# Patient Record
Sex: Female | Born: 1980 | Race: White | Hispanic: No | Marital: Married | Smoking: Never smoker
Health system: Southern US, Community
[De-identification: ages and names within clinical notes are randomized; demographics above are authoritative.]

## PROBLEM LIST (undated history)

## (undated) DIAGNOSIS — K5520 Angiodysplasia of colon without hemorrhage: Secondary | ICD-10-CM

## (undated) DIAGNOSIS — N946 Dysmenorrhea, unspecified: Secondary | ICD-10-CM

## (undated) DIAGNOSIS — R002 Palpitations: Secondary | ICD-10-CM

## (undated) DIAGNOSIS — J45909 Unspecified asthma, uncomplicated: Secondary | ICD-10-CM

## (undated) HISTORY — DX: Unspecified asthma, uncomplicated: J45.909

## (undated) HISTORY — DX: Angiodysplasia of colon without hemorrhage: K55.20

## (undated) HISTORY — DX: Dysmenorrhea, unspecified: N94.6

## (undated) HISTORY — PX: OTHER SURGICAL HISTORY: SHX169

## (undated) HISTORY — PX: WISDOM TOOTH EXTRACTION: SHX21

## (undated) HISTORY — DX: Palpitations: R00.2

---

## 2000-10-22 ENCOUNTER — Other Ambulatory Visit: Admission: RE | Admit: 2000-10-22 | Discharge: 2000-10-22 | Payer: Self-pay | Admitting: Obstetrics and Gynecology

## 2001-11-19 ENCOUNTER — Ambulatory Visit (HOSPITAL_COMMUNITY): Admission: RE | Admit: 2001-11-19 | Discharge: 2001-11-19 | Payer: Self-pay | Admitting: Obstetrics and Gynecology

## 2001-11-19 ENCOUNTER — Encounter: Payer: Self-pay | Admitting: Obstetrics and Gynecology

## 2001-12-17 ENCOUNTER — Encounter: Payer: Self-pay | Admitting: Obstetrics and Gynecology

## 2001-12-17 ENCOUNTER — Ambulatory Visit (HOSPITAL_COMMUNITY): Admission: RE | Admit: 2001-12-17 | Discharge: 2001-12-17 | Payer: Self-pay | Admitting: Obstetrics and Gynecology

## 2003-04-26 ENCOUNTER — Other Ambulatory Visit: Admission: RE | Admit: 2003-04-26 | Discharge: 2003-04-26 | Payer: Self-pay | Admitting: Obstetrics and Gynecology

## 2004-05-08 ENCOUNTER — Other Ambulatory Visit: Admission: RE | Admit: 2004-05-08 | Discharge: 2004-05-08 | Payer: Self-pay | Admitting: Obstetrics and Gynecology

## 2005-04-13 ENCOUNTER — Other Ambulatory Visit: Admission: RE | Admit: 2005-04-13 | Discharge: 2005-04-13 | Payer: Self-pay | Admitting: Obstetrics and Gynecology

## 2007-01-10 ENCOUNTER — Ambulatory Visit (HOSPITAL_COMMUNITY): Admission: RE | Admit: 2007-01-10 | Discharge: 2007-01-10 | Payer: Self-pay | Admitting: Internal Medicine

## 2007-05-30 ENCOUNTER — Encounter
Admission: RE | Admit: 2007-05-30 | Discharge: 2007-05-30 | Payer: Self-pay | Admitting: Physical Medicine and Rehabilitation

## 2007-06-19 DIAGNOSIS — K5520 Angiodysplasia of colon without hemorrhage: Secondary | ICD-10-CM

## 2007-06-19 HISTORY — DX: Angiodysplasia of colon without hemorrhage: K55.20

## 2007-09-29 ENCOUNTER — Inpatient Hospital Stay (HOSPITAL_COMMUNITY): Admission: EM | Admit: 2007-09-29 | Discharge: 2007-09-30 | Payer: Self-pay | Admitting: Emergency Medicine

## 2007-10-07 ENCOUNTER — Ambulatory Visit (HOSPITAL_COMMUNITY): Admission: RE | Admit: 2007-10-07 | Discharge: 2007-10-07 | Payer: Self-pay | Admitting: Internal Medicine

## 2007-10-22 ENCOUNTER — Ambulatory Visit (HOSPITAL_COMMUNITY): Admission: RE | Admit: 2007-10-22 | Discharge: 2007-10-22 | Payer: Self-pay | Admitting: Gastroenterology

## 2007-12-23 ENCOUNTER — Inpatient Hospital Stay (HOSPITAL_COMMUNITY): Admission: EM | Admit: 2007-12-23 | Discharge: 2007-12-26 | Payer: Self-pay | Admitting: Emergency Medicine

## 2010-08-26 ENCOUNTER — Inpatient Hospital Stay (HOSPITAL_COMMUNITY)
Admission: AD | Admit: 2010-08-26 | Discharge: 2010-08-26 | Disposition: A | Discharge status: Home / Self Care | Payer: Managed Care, Other (non HMO) | Source: Ambulatory Visit | Attending: Obstetrics and Gynecology | Admitting: Obstetrics and Gynecology

## 2010-08-26 DIAGNOSIS — O4700 False labor before 37 completed weeks of gestation, unspecified trimester: Secondary | ICD-10-CM | POA: Insufficient documentation

## 2010-08-26 LAB — URINALYSIS, ROUTINE W REFLEX MICROSCOPIC
Bilirubin Urine: NEGATIVE
Ketones, ur: NEGATIVE mg/dL
Nitrite: NEGATIVE
Protein, ur: NEGATIVE mg/dL
Specific Gravity, Urine: 1.005 (ref 1.005–1.030)
Urobilinogen, UA: 0.2 mg/dL (ref 0.0–1.0)

## 2010-10-13 ENCOUNTER — Inpatient Hospital Stay (HOSPITAL_COMMUNITY): Admission: AD | Admit: 2010-10-13 | Payer: Self-pay | Admitting: Obstetrics and Gynecology

## 2010-10-15 ENCOUNTER — Inpatient Hospital Stay (HOSPITAL_COMMUNITY)
Admission: AD | Admit: 2010-10-15 | Discharge: 2010-10-15 | Disposition: A | Discharge status: Home / Self Care | Payer: Managed Care, Other (non HMO) | Source: Ambulatory Visit | Attending: Obstetrics and Gynecology | Admitting: Obstetrics and Gynecology

## 2010-10-15 DIAGNOSIS — O479 False labor, unspecified: Secondary | ICD-10-CM | POA: Insufficient documentation

## 2010-10-16 ENCOUNTER — Inpatient Hospital Stay (HOSPITAL_COMMUNITY)
Admission: AD | Admit: 2010-10-16 | Discharge: 2010-10-18 | DRG: 775 | Disposition: A | Discharge status: Home / Self Care | Payer: Managed Care, Other (non HMO) | Source: Ambulatory Visit | Attending: Obstetrics and Gynecology | Admitting: Obstetrics and Gynecology

## 2010-10-16 DIAGNOSIS — Z22330 Carrier of Group B streptococcus: Secondary | ICD-10-CM

## 2010-10-16 DIAGNOSIS — O99892 Other specified diseases and conditions complicating childbirth: Secondary | ICD-10-CM | POA: Diagnosis present

## 2010-10-16 LAB — RPR: RPR Ser Ql: NONREACTIVE

## 2010-10-16 LAB — CBC
MCH: 33 pg (ref 26.0–34.0)
MCHC: 34.2 g/dL (ref 30.0–36.0)
Platelets: 252 10*3/uL (ref 150–400)
RBC: 3.91 MIL/uL (ref 3.87–5.11)
RDW: 13.9 % (ref 11.5–15.5)

## 2010-10-18 LAB — CBC
HCT: 30.2 % — ABNORMAL LOW (ref 36.0–46.0)
Hemoglobin: 10.1 g/dL — ABNORMAL LOW (ref 12.0–15.0)
MCH: 32.3 pg (ref 26.0–34.0)
MCV: 96.5 fL (ref 78.0–100.0)
RBC: 3.13 MIL/uL — ABNORMAL LOW (ref 3.87–5.11)

## 2010-10-31 NOTE — Op Note (Signed)
NAMEJASIA, Audrey Davis             ACCOUNT NO.:  [id number]   MEDICAL RECORD NO.:  [id number]          PATIENT TYPE:  INP   LOCATION:  1843                         FACILITY:  [facility]   PHYSICIAN:  John C. Madilyn Fireman, M.D.    DATE OF BIRTH:  1980/12/26   DATE OF PROCEDURE:  12/23/2007  DATE OF DISCHARGE:                               OPERATIVE REPORT   REASON FOR CONSULTATION:  Recurrent upper GI bleed with hematemesis with  etiology undiagnosed after a thorough workup including EGD, colonoscopy  and capsule endoscopy approximately one month ago.  She also presented  with hematemesis at time as well.   PROCEDURE:  The patient was placed in the left lateral decubitus  position and placed on the pulse monitor with continuous low-flow oxygen  delivered by nasal cannula.  She was sedated with 100 mcg IV fentanyl  and 10 mg IV Versed.  The Olympus video endoscope was advanced under  direct vision into the oropharynx and esophagus.  The esophagus was  straight and of normal caliber with the squamocolumnar line at 38 cm.  There was no visible hiatal hernia, ring, stricture or other abnormality  of the GE junction.  The stomach was entered, and there was a fairly  large body of amorphous viscous, dark brown to [employer] with  white solid food consistent with sandwich he had recently eaten.  This  was very viscous and did not suction or dilute itself with lavage very  easily.  This obscured good bit of the posterior wall and greater  curvature of the fundus up to near the cardia; however, I was able to  visualize the cardia which appeared to be normal and free of any obvious  bleeding lesion.  Just distal to the fundic pool, there was an elevated,  ovoid, cherry red lesion possibly consistent with a Dieulafoy lesion.  However, it was not bleeding, and there was no clot associated with it,  and it could have represented simple nasogastric tube trauma.  Distal to  the pool of old blood and  food, visualization was fairly good, although  there was some adherent coffee ground material that for the most part  lavaged fairly easily, and I did not see any potential bleeding lesions.  The distal antrum and pylorus was well inspected and appeared to be  within normal limits.  The duodenum was entered, and both bulb and  second portion were well inspected and appeared to be within normal  limits except for some slight contact friability in the distal bulb but  no obvious vascular lesion there.  The scope was withdrawn back to the  earlier mentioned possible Dieulafoy lesion, and two Endo-Clips were  placed.  This did not result in any bleeding.  The scope was then  withdrawn, and the patient returned to the recovery room in stable  condition.  She tolerated the procedure well.  There were no immediate  complications.   IMPRESSION:  1. Evidence of recent bleeding within the stomach but no obvious      active bleeding at this time.  2. 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   PLAN:  Continue resuscitation, IV fluids, IV Protonix and will probably  need repeat endoscopy for better diagnostic accuracy within the next 24-  48 hours.           ______________________________  Everardo All Madilyn Fireman, M.D.     JCH/MEDQ  D:  12/23/2007  T:  12/23/2007  Job:  [id number]

## 2010-10-31 NOTE — H&P (Signed)
NAMEJASDEEP, Audrey Davis             ACCOUNT NO.:  [id number]   MEDICAL RECORD NO.:  [id number]          PATIENT TYPE:  INP   LOCATION:  1829                         FACILITY:  [facility]   PHYSICIAN:  Darryl D. Prime, MD    DATE OF BIRTH:  [birth year]   DATE OF ADMISSION:  09/28/2007  DATE OF DISCHARGE:                              HISTORY & PHYSICAL   CODE STATUS:  The patient is a full code.   PRIMARY CARE PHYSICIAN:  Dr. Marisue Brooklyn, [facility].   HISTORIAN:  The patient; she is a good historian.   TOTAL VISIT TIME:  Approximately 40 minutes.   CHIEF COMPLAINT:  Just threw up blood.   HISTORY OF PRESENT ILLNESS:  Audrey Davis is a 30 year old female with a  history of painful menses, with her menses starting about a week ago.  She usually takes NSAIDs in the form of naproxen for her painful periods  and took naproxen 220 mg x4 a week ago; she also took 3 two days ago and  3 tablets the day before that. The patient notes apparently low flow the  2nd day of her cycle, which is unusual for her, and she then developed 4  days prior to admission, a headache, retro-orbital on the right.  The  patient also started noting abdominal bloating and back pain.  Two days  prior to admission, she had severe nausea and lightheaded.  One day  prior to admission, she had another episode in the nighttime of nausea  and syncope in the bathroom.  The patient had shortly thereafter black  stools, very loose.  She was drowsy all day today and at 9 p.m. on the  evening prior to admission, she had apparent coffee-grounds emesis and  EMS was called.  On arrival to the emergency room here, she had another  bout of coffee-grounds emesis, supposedly copious amounts for both.  The  patient, in the emergency room, was found to have a blood pressure of  76/44; three liters of fluid were given and blood pressure increased to  104/60.  In the emergency room she was given Zofran.   PAST MEDICAL  AND SURGICAL HISTORY:  1. History of remote migraine headache.  2. History of dysmenorrhea.   MEDICATIONS:  She is on as-needed naproxen for 10 years intermittently  for painful periods.   ALLERGIES:  No known drug allergies.   SOCIAL HISTORY:  No history of tobacco.  Rare alcohol use.  She has been  married for the last 8 years, no children.  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   FAMILY HISTORY:  The patient's family history is negative for GI  cancers.  Grandmother had breast cancer.   REVIEW OF SYSTEMS:  Fourteen-point review of systems is negative, other  than as stated above.   PHYSICAL EXAMINATION:  VITAL SIGNS:  As above for the blood pressure.  Her temperature is 96.4, respiratory rate of 16, pulse of 92-106,  saturations 100% on room air.  GENERAL:  She is a female who looks her stated age, lying flat  in bed,  in no acute distress.  HEENT:  Normocephalic, atraumatic.  Her pupils are equal, round and  reactive to light with extraocular movements being intact.  Conjunctivae  are significantly pale.  The oropharynx is dry.  SKIN:  Her skin is pale in general.  NECK:  Supple with no lymphadenopathy or thyromegaly.  No jugulovenous  distention.  LUNGS:  Clear to auscultation bilaterally.  CARDIOVASCULAR:  Regular rhythm with fast rate with no murmurs, rubs, or  gallops, normal S1 and S2, no S3 or S4.  ABDOMEN:  Soft, nontender and non-distended with no signs of  hepatosplenomegaly.  EXTREMITIES:  No clubbing, cyanosis or edema.  NEUROLOGIC:  She is alert and oriented x4 and cranial nerves II-XII  grossly intact and strength and sensation are grossly intact.  Reflexes  are 2+ and symmetric bilaterally across the knees.   LABORATORY DATA:  Laboratory data show a white count of 17, hemoglobin  between 8.5 and 8.8, hematocrit of 25 to 25.6, platelets 230,000, segs  77, lymphocytes 19.  She is Gastroccult positive with the contents  brought  by EMS.  Sodium 138, potassium 3.4, chloride 106, bicarb 20, BUN  41, creatinine 0.8, glucose of 183.   ASSESSMENT AND PLAN:  1. This is a patient with intermittent nonsteroidal anti-inflammatory      drug use who presents with upper gastrointestinal bleed.  It may be      a nonsteroidal anti-inflammatory drug gastritis and most likely an      ulcer, either duodenal or gastric.  For her upper gastrointestinal      bleed, we will get serial hematocrits and give intravenous fluids      and blood, check an H. pylori.  Gastroenterology Medicine was      consulted and will see her.  Protonix 40 mg intravenously now and      then every 12 hours and discussed the discontinuation of      nonsteroidal anti-inflammatory drugs with the patient.  2. For her hypovolemia and low blood pressure, we will give packed red      blood cells and follow.  3. For her hypokalemia, we will replete.  4. For elevated blood sugar, we will follow.  Likely, this is stress      related.  5. For her leukocytosis, we will follow, also likely stress related.      We will check a lipase.  6. Prophylaxis for deep venous thrombosis with pneumatic compression      devices.      Darryl D. Prime, MD  Electronically Signed     DDP/MEDQ  D:  09/29/2007  T:  09/29/2007  Job:  [id number]

## 2010-10-31 NOTE — Op Note (Signed)
Audrey Davis, Audrey Davis NO.:  [id number]   MEDICAL RECORD NO.:  [id number]         PATIENT TYPE:  CINP   LOCATION:                               FACILITY:  [facility]   PHYSICIAN:  Shirley Friar, MDDATE OF BIRTH:  [birth year]   DATE OF PROCEDURE:  12/25/2007  DATE OF DISCHARGE:                               OPERATIVE REPORT   INDICATIONS:  Hematemesis, recent upper endoscopy which showed a  possible Dieulafoy lesion in the proximal stomach, and repeat upper  endoscopy done to re-evaluate stomach due to moderate amount of food  particles during previous upper endoscopy.   MEDICATIONS:  1. Fentanyl 75 mcg IV.  2. Versed 6 mg IV.   FINDINGS:  Endoscope was inserted through oropharynx and esophagus was  intubated which was normal in its entirety.  Endoscope was advanced down  into the stomach where 2 hemoclips were noted be attached to the focal  area of the fundic wall with focal area of erythema between the 2  hemoclips.  No active bleeding was seen in the stomach.  The stomach was  completely visualized and no other abnormalities were seen.  Retroflexion was done which revealed normal proximal stomach, otherwise.  Endoscope was straightened and advanced to the duodenal bulb and the  second portion of the duodenum which were both normal.  Endoscope was  then withdrawn to confirm the above findings.   ASSESSMENT:  1. 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  2. No other gastric abnormalities seen.   PLAN:  1. Clear liquid diet and advance as tolerated.  2. Avoid NSAIDs.      Shirley Friar, MD  Electronically Signed     VCS/MEDQ  D:  12/25/2007  T:  12/26/2007  Job:  [id number]   cc:   Danise Edge, M.D.  John C. Madilyn Fireman, M.D.

## 2010-10-31 NOTE — Op Note (Signed)
NAMELAQUONDA, WELBY NO.:  [id number]   MEDICAL RECORD NO.:  [id number]          PATIENT TYPE:  INP   LOCATION:  6711                         FACILITY:  [facility]   PHYSICIAN:  Danise Edge, M.D.   DATE OF BIRTH:  1980/07/10   DATE OF PROCEDURE:  DATE OF DISCHARGE:                               OPERATIVE REPORT   PROCEDURE INDICATIONS:  Ms. Chesney Suares is a 30 year old female born  on 08/14/1980.  Ms. Flesch was admitted to [facility]  through the emergency room to evaluate gastrointestinal bleeding  manifested by hematemesis and the passage of dark stool.  She was taking  nonsteroidal anti-inflammatory drugs due to dysmenorrhea.  There is no  history of peptic ulcer disease.   MEDICATION ALLERGIES:  None.   PAST MEDICAL - SURGICAL HISTORY:  Migraine headaches and dysmenorrhea.   HABITS:  Ms. Schwenn does not use tobacco products and consumes alcohol  in moderation.   ENDOSCOPIST:  Danise Edge, M.D.   PREMEDICATIONS:  Fentanyl 25 mcg and Versed 5 mg.   PROCEDURE:  After obtaining informed consent, Ms. Courter was placed in  the left lateral decubitus position.  I administered intravenous  fentanyl and intravenous Versed to achieve conscious sedation for the  procedure.  The patient's blood pressure, oxygen saturation, and cardiac  rhythm were monitored throughout the procedure and documented in the  medical record.   The Pentax gastroscope was passed through the posterior hypopharynx into  the proximal esophagus without difficulty.  The hypopharynx, larynx, and  vocal cords appeared normal.   ESOPHAGOSCOPY:  The proximal mid and lower segments of the esophageal  mucosa appears completely normal.  There are no signs of esophageal  bleeding.  There are no signs of esophageal varices or Mallory-Weiss  tears.   GASTROSCOPY:  Retroflex view of the gastric cardia and fundus reveals 2  small erosions in the gastric fundus.  The gastric  body appears normal.  There are scattered nonbleeding erosions in the gastric antrum.  The  gastric pylorus appears normal.   DUODENOSCOPY:  The duodenal bulb and second portion of duodenum appear  completely normal.   ASSESSMENT:  Essentially normal esophagogastroduodenoscopy except for  nonbleeding scattered erosions in the gastric antrum and 2 isolated  erosions in the gastric fundus.  I have no explanation for Ms.  Brue's apparent upper gastrointestinal bleeding.   RECOMMENDATIONS:  Transferred to a medical bed on the floor.  Regular  diet.  If signs of bleeding persists, schedule capsule enteroscopy.           ______________________________  Danise Edge, M.D.     MJ/MEDQ  D:  09/29/2007  T:  09/30/2007  Job:  [id number]

## 2010-10-31 NOTE — Discharge Summary (Signed)
NAMESELINA, TAPPER             ACCOUNT NO.:  [id number]   MEDICAL RECORD NO.:  [id number]           PATIENT TYPE:   LOCATION:                                 FACILITY:   PHYSICIAN:  Shirley Friar, MDDATE OF BIRTH:  11/17/1965   DATE OF ADMISSION:  12/23/2007  DATE OF DISCHARGE:  12/26/2007                               DISCHARGE SUMMARY   ADMITTING DIAGNOSIS:  Hematemesis and melena.   DISCHARGE DIAGNOSES:  1. Dieulafoy arteriovenous malformation.  2. Upper gastrointestinal bleed and acute blood loss anemia secondary      to Dieulafoy arteriovenous malformation.  3. Consistent with her past medical history, severe dysmenorrhea, as      well as migraine headaches.   SERVICE:  Gastroenterology.   ATTENDING:  Everardo All. Madilyn Fireman, MD   PROCEDURES:  1. On December 23, 2007, she had an upper endoscopy with Dr. Dorena Cookey who      found evidence of recent bleeding in the stomach without active      bleeding.  He also found a possible Dieulafoy AVM and applied two      EndoClips to it to obtain homeostasis.  2. On December 25, 2007, upper endoscopy by Dr. Charlott Rakes.   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  No other gastric abnormalities seen.   PLAN:  Clear liquid diet and advance as tolerated, avoid NSAIDs.   CONSULTS:  None.   HISTORY AND PHYSICAL:  This is a very pleasant 30 year old female who  experienced hematemesis and melena during menses in April 2009.  She  required hospitalization and was treated by Dr. Danise Edge.  No  source of bleeding was found through a full GI workup that included an  inpatient upper endoscopy, as well as a colonoscopy and a given capsule  endoscopy as an outpatient in April-May time frame.  Her current episode  of hematemesis and melena was again preceded by the onset of her menses,  as well as nausea and back pain.  She tells me that immediately prior  coming to the ER she had palpitations.   Her hematemesis began once she  was admitted to the emergency room .  She became hypotensive and was  given a fluid bolus.  She was also given 2 units of packed red blood  cells and emergently endoscoped.  She was admitted to the step-down  unit, placed on IV Protonix infusion and monitored closely.  Her  hemoglobin dropped to a level of 7.1.  the next morning after her 2  units of packed red blood cells, her hemoglobin was 10.3.  It did drop  to 9.2 during the day.  She had had no bowel movements overnight and she  was feeling much better.  On Thursday December 25, 2007, she was re-  endoscoped by Dr. Charlott Rakes who found no bleeding.  EndoClips  were in place.  No further lesions were seen. After her endoscopy, her  diet was advanced and she was given one more unit of blood for a total  of three.  This morning on  December 17, 2007, the patient described no pain,  no hematemesis.  She was tolerating her full diet well.  She looked well  and reported that she was ready to go home.   PHYSICAL EXAMINATION:  She was alert and oriented in no apparent  distress.  Her lungs sounded clear to auscultation.  Her heart had a regular rate and rhythm with no murmurs, rubs or  gallops.  Her abdomen was soft, nontender, nondistended with good bowel sounds.   LABS:  Showed a hemoglobin of 11.3 that was on December 25, 2007, post  transfusion.  I would expect that her hemoglobin would drop to  approximately 10.2 after equilibration.   She was discharged to home in good condition.  Follow-up appointment was  arranged with Dr. Danise Edge in 3-4 weeks.  She was given a  prescription for Protonix 40 mg 1 pill daily for 4 weeks, Phenergan 25  mg p.o. 1 tablet q.12 p.r.n. nausea  #30, Darvocet-N 100 one tablet p.o. q. 4-6 hours p.r.n. pain, #30 no  refills were given.  The patient was advised to call [facility]  Gastro[facility]ology with any signs of hematemesis or further bleeding at  [phone].      Stephani Police, [place]      Shirley Friar, MD  Electronically Signed    MLY/MEDQ  D:  12/26/2007  T:  12/27/2007  Job:  [id number]   cc:   Marcelino Duster L. Vincente Poli, M.D.  Lovenia Kim, D.O.  Danise Edge, M.D.

## 2010-10-31 NOTE — Op Note (Signed)
[name], [name]             ACCOUNT NO.:  [id number]   MEDICAL RECORD NO.:  [id number]          PATIENT TYPE:  AMB   LOCATION:  ENDO                         [facility]ACILITY:  [facility]   PHYSICIAN:  Danise Edge, M.D.   DATE OF BIRTH:  1981/05/25   DATE OF PROCEDURE:  10/22/2007  DATE OF DISCHARGE:  10/22/2007                               OPERATIVE REPORT   PROCEDURE:  Given capsule enteroscopy.   INDICATIONS FOR PROCEDURE:  [name] is a 30 year old female  born 1980/07/14.  [name] was admitted to [facility]  through the emergency room on September 28, 2007, to evaluate  gastrointestinal bleeding manifested by hematemesis and the passage of  dark stool.  She was taking nonsteroidal anti-inflammatory drugs to  treat this menorrhagia.  There was no past history of peptic ulcer  disease.   ALLERGIES:  No known drug allergies.   PAST MEDICAL - SURGICAL HISTORY:  Migraine headaches and dysmenorrhea.   HABITS:  Audrey Davis does not use tobacco products and consumes alcohol  in moderation.   On September 29, 2007, her esophagogastroduodenoscopy was normal.  She  received 2 units packed red blood cells and discharged from the  hospital.   Her discharge hemoglobin was 9.5 grams.  She was followed up by her  primary care physician and when her hemoglobin dropped to 7 grams, she  received 2 units of fresh frozen plasma but did not receive packed red  blood cells.   Approximately one week ago, she underwent a repeat  esophagogastroduodenoscopy which was normal.  She also underwent a  proctocolonoscopy to the cecum which was normal.   On Oct 22, 2007, she underwent a Given capsule enteroscopy.   CAPSULE ENDOSCOPY REPORT:  The first gastric image is noted at 50  minutes.  The first duodenal image is noted at 2 hours 21 minutes and 54  seconds.  The first cecal image is noted at 7 hours 1 minute and 37  seconds.   The small bowel capsule enteroscopy was  completely normal.  No bleeding  or small bowel lesions were identified.   ASSESSMENT:  Normal capsule enteroscopy performed Oct 22, 2007.           ______________________________  Danise Edge, M.D.     MJ/MEDQ  D:  10/24/2007  T:  10/24/2007  Job:  [id number]   cc:   Lovenia Kim, D.O.  Fax: [phone]

## 2010-10-31 NOTE — Discharge Summary (Signed)
Audrey Davis, Audrey Davis             ACCOUNT NO.:  [id number]   MEDICAL RECORD NO.:  [id number]          PATIENT TYPE:  INP   LOCATION:  6711                         FACILITY:  [facility]   PHYSICIAN:  Hillery Aldo, M.D.   DATE OF BIRTH:  Aug 13, 1980   DATE OF ADMISSION:  09/28/2007  DATE OF DISCHARGE:  09/30/2007                               DISCHARGE SUMMARY   PRIMARY CARE PHYSICIAN:  Lovenia Kim, D.O.   DISCHARGE DIAGNOSES:  1. Acute upper gastrointestinal bleed, secondary to antral erosions      from nonsteroidal anti-inflammatory medications.  2. Hypokalemia, repleted.  3. Leukocytosis, resolved.  4. Transient hypotension.  5. Transient hyperglycemia.  6. Dysmenorrhea.   DISCHARGE MEDICATIONS:  Protonix 40 mg daily.   CONSULTATIONS:  Danise Edge, M.D. of Gastroenterology.   BRIEF ADMISSION HISTORY OF PRESENT ILLNESS:  The patient is a very  pleasant 30 year old female who presented to the hospital with chief  complaint of hematemesis and melena.  The patient had been taking  longstanding nonsteroidal anti-inflammatory medications to treat her  underlying dysmenorrhea.  The patient presented to the hospital and upon  initial evaluation, was found to be hypotensive and anemic and therefore  was admitted for further evaluation and workup.  For the full details,  please see the dictated report done by Dr. Oralia Rud.   PROCEDURES AND DIAGNOSTIC STUDIES:  Upper endoscopy on 09/29/2007 showed  2 small erosions in the gastric fundus.  The gastric body was normal.  There were scattered non-bleeding erosions in the gastric antrum.  Gastric pylorus was normal.  The duodenal bulb and second portion of the  duodenum were normal.   DISCHARGE LABORATORY VALUES:  Sodium was 140, potassium 3.9, chloride  110, bicarb 25, BUN 8, creatinine 0.61, and glucose 92.  White blood  cell count was 6.6, hemoglobin 9.5, hematocrit 27.4, and platelets 159.  Hemoglobin A1c was 4.8%.   HOSPITAL  COURSE:  1. Acute upper GI bleed, likely due to antral and fundal erosions      secondary to nonsteroidal anti-inflammatory medications:  Although      the patient underwent upper endoscopy, no active bleeding was      noted.  Nevertheless, the patient was stabilized and transfused      with 2 units of packed red blood cells.  Hemoglobin appropriately      rose and was stable with no further complaints of hematemesis.  The      patient was advised to discontinue all nonsteroidal anti-      inflammatory drug use and was put on IV proton pump inhibitor      therapy initially.  She will be discharged on additional therapy      with proton pump inhibitor medications.  If she develops any      further signs of bleeding, she will need a capsule enteroscopy.  2. Hypokalemia:  The patient was appropriately repleted.  3. Leukocytosis:  The patient had a mild leukocytosis on initial      presentation.  This was likely a stress reaction.  It quickly      resolved at the  time of discharge.  4. Hypotension:  Likely due to acute blood loss, anemia.  She was      hemodynamically stable after receiving 2 units of packed red blood      cells.  5. Hyperglycemia:  The patient had transient hyperglycemia, again      likely due to a stress response.  Her hemoglobin A1c value was      normal.   DISPOSITION:  The patient was medically stable and will be discharged  home.  She should follow up with her primary care physician next week.      Hillery Aldo, M.D.  Electronically Signed     CR/MEDQ  D:  09/30/2007  T:  10/01/2007  Job:  [id number]

## 2010-10-31 NOTE — H&P (Signed)
Audrey Davis, Audrey Davis             ACCOUNT NO.:  [id number]   MEDICAL RECORD NO.:  [id number]          PATIENT TYPE:  INP   LOCATION:  2626                         FACILITY:  [facility]   PHYSICIAN:  Audrey Davis, M.D.    DATE OF BIRTH:  1980-10-21   DATE OF ADMISSION:  12/23/2007  DATE OF DISCHARGE:                              HISTORY & PHYSICAL   REASON FOR ADMISSION:  Hematemesis and melena, admit to gastroenterology  service.   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  Her upper endoscopy done on September 29, 2007, by Dr. Danise Edge was normal.  The patient was discharged  home on September 30, 2007.  In followup, she had a colonoscopy as well as a  given capsule endoscopy by Dr. Danise Edge, which were both normal.  She has had no recurrence of her hematemesis until today.  The patient  is currently vomiting in the emergency room, so her history was  collected from her family at bedside.  The patient has a long history of  dysmenorrhea with nausea, anorexia, and low-grade fevers associated with  it.  Both her hematemesis in April as well as her hematemesis today  occurred during her menstrual cycle.  She tells me she sees no NSAIDs  since April.  She takes only homeopathic medications.  She has no other  illnesses.  Stools became dark again this past Sunday, feelings of  weakness and nausea started again as well.  She came to the hospital  today secondary to black stools but on her way actually had a  presyncopal episode in her car.  She began vomiting blood in the  emergency room an NG tube was placed, during which a large amount of  bright red blood came up.  She is hypotensive.  Her blood pressure was  initially 73/46 with fluids.  Her blood pressure has come up to 115/60.   Past medical history is significant for dysmenorrhea as well as  remote  history of migraine headaches.   CURRENT MEDICATIONS:  1. Magnesia phosphorica.  She takes 3-4 pills a day.  2. Colocynthis.  These are both taken for abdominal cramps.  Again,      she denies any NSAID use.   Review of systems as per HPI.   Social history is negative for tobacco.  She drinks a moderate amount of  alcohol, approximately two glasses of red wine three times a week;  however, she does report having 2 kg this past weekend on her husband's  birthday.   Family history is negative for gastric cancer, ulcers, and liver  disease.   On physical exam, she is alert and oriented but in some distress that  she is vomiting blood as an NG tube is being placed.  Vital signs of  454, temperature 98, pulse 112, respirations 22, and blood pressure  115/68.  Heart has a rate of 112 but no murmurs, rubs, or gallops were  appreciated.  Lungs are clear to auscultation  bilaterally.  Abdomen is  soft, nontender, and nondistended with good bowel sounds.   Labs show hemoglobin of 8.8, it was 9.4 at discharge in April, however,  her husband tells me that it was recently up to approximately 12.  White  count 7.8, hematocrit 12.9, and platelets 216,000.  Her BUN is 36,  creatinine 0.6, and glucose is 111.  Chest x-ray taken today shows no  acute chest process.  It does report gastric distention with air.   ASSESSMENT:  Dr. Dorena Cookey has seen and examined the patient, collected  her history, and reviewed her chart.  His impression is this is a 32-  year-old female with a recurrence of melena and hematemesis of uncertain  origin at this point appeared to be related to administration of cycle.   PLAN:  Stabilize the patient.  Admit to gastroenterology.  Plan for an  upper endoscopy within the next hour and half.  We will start Protonix  infusion, fluids, and bloods.      Stephani Police, PA    ______________________________  Audrey Davis, M.D.    MLY/MEDQ  D:  12/23/2007   T:  12/24/2007  Job:  [id number]   cc:   Danise Edge, M.D.

## 2011-03-13 LAB — CBC
HCT: 25.6 — ABNORMAL LOW
HCT: 27.4 — ABNORMAL LOW
HCT: 31.2 — ABNORMAL LOW
Hemoglobin: 8.8 — ABNORMAL LOW
Hemoglobin: 9.5 — ABNORMAL LOW
MCHC: 34.4
MCV: 98.4
Platelets: 163
RBC: 2.8 — ABNORMAL LOW
RDW: 13
RDW: 13.8
RDW: 14
WBC: 6.6

## 2011-03-13 LAB — TYPE AND SCREEN

## 2011-03-13 LAB — POCT PREGNANCY, URINE
Operator id: 277751
Preg Test, Ur: NEGATIVE

## 2011-03-13 LAB — URINALYSIS, ROUTINE W REFLEX MICROSCOPIC
Hgb urine dipstick: NEGATIVE
Nitrite: NEGATIVE
Specific Gravity, Urine: 1.027
Urobilinogen, UA: 0.2
pH: 5.5

## 2011-03-13 LAB — BASIC METABOLIC PANEL
BUN: 15
Calcium: 7.4 — ABNORMAL LOW
GFR calc non Af Amer: >60
GFR calc non Af Amer: >60
Glucose, Bld: 80
Glucose, Bld: 92
Potassium: 3.9
Sodium: 140

## 2011-03-13 LAB — DIFFERENTIAL
Basophils Absolute: 0.1
Basophils Relative: 0
Eosinophils Absolute: 0.2
Eosinophils Relative: 1
Lymphocytes Relative: 19
Monocytes Absolute: 0.6

## 2011-03-13 LAB — PREPARE FRESH FROZEN PLASMA

## 2011-03-13 LAB — POCT I-STAT, CHEM 8
Creatinine, Ser: 0.8
Glucose, Bld: 183 — ABNORMAL HIGH
Hemoglobin: 8.5 — ABNORMAL LOW
TCO2: 20

## 2011-03-13 LAB — HEMOGLOBIN AND HEMATOCRIT, BLOOD: Hemoglobin: 10.7 — ABNORMAL LOW

## 2011-03-13 LAB — LIPASE, BLOOD: Lipase: 14

## 2011-03-13 LAB — HEMOGLOBIN A1C: Hgb A1c MFr Bld: 4.8

## 2011-03-13 LAB — GASTRIC OCCULT BLOOD (1-CARD TO LAB): pH, Gastric: 5

## 2011-03-15 LAB — URINALYSIS, ROUTINE W REFLEX MICROSCOPIC
Ketones, ur: NEGATIVE
Nitrite: NEGATIVE
Specific Gravity, Urine: 1.021
pH: 7

## 2011-03-15 LAB — CBC
HCT: 21.2 — ABNORMAL LOW
HCT: 27 — ABNORMAL LOW
HCT: 27 — ABNORMAL LOW
HCT: 27.9 — ABNORMAL LOW
Hemoglobin: 9.2 — ABNORMAL LOW
Hemoglobin: 9.5 — ABNORMAL LOW
MCHC: 33.7
MCHC: 33.9
MCHC: 33.9
MCHC: 34.2
MCHC: 34.6
MCV: 87.8
MCV: 90
MCV: 90.7
Platelets: 153
Platelets: 162
Platelets: 168
Platelets: 216
RBC: 2.42 — ABNORMAL LOW
RDW: 18.8 — ABNORMAL HIGH
RDW: 18.9 — ABNORMAL HIGH
RDW: 18.9 — ABNORMAL HIGH
RDW: 19.1 — ABNORMAL HIGH
RDW: 21.9 — ABNORMAL HIGH
WBC: 7.5

## 2011-03-15 LAB — COMPREHENSIVE METABOLIC PANEL
AST: 20
Albumin: 3 — ABNORMAL LOW
Albumin: 3.1 — ABNORMAL LOW
Alkaline Phosphatase: 30 — ABNORMAL LOW
BUN: 6
Calcium: 8.5
Calcium: 8.5
Creatinine, Ser: 0.66
GFR calc Af Amer: >60
Potassium: 3.7
Sodium: 137
Total Protein: 4.6 — ABNORMAL LOW
Total Protein: 4.8 — ABNORMAL LOW

## 2011-03-15 LAB — DIFFERENTIAL
Basophils Relative: 0
Eosinophils Absolute: 0.1
Monocytes Absolute: 0.4
Neutro Abs: 5.7
Neutrophils Relative %: 74

## 2011-03-15 LAB — CROSSMATCH: ABO/RH(D): O POS

## 2011-03-15 LAB — POCT I-STAT, CHEM 8
Calcium, Ion: 1.18
Chloride: 107
HCT: 30 — ABNORMAL LOW
Potassium: 3.7

## 2011-03-15 LAB — PREPARE RBC (CROSSMATCH)

## 2011-03-15 LAB — URINE MICROSCOPIC-ADD ON

## 2011-03-15 LAB — HEMOGLOBIN AND HEMATOCRIT, BLOOD: Hemoglobin: 11.8 — ABNORMAL LOW

## 2011-03-15 LAB — POCT PREGNANCY, URINE: Operator id: 277751

## 2011-12-13 ENCOUNTER — Other Ambulatory Visit (HOSPITAL_COMMUNITY): Payer: Self-pay | Admitting: Internal Medicine

## 2011-12-13 DIAGNOSIS — R102 Pelvic and perineal pain: Secondary | ICD-10-CM

## 2011-12-13 DIAGNOSIS — R197 Diarrhea, unspecified: Secondary | ICD-10-CM

## 2011-12-18 ENCOUNTER — Ambulatory Visit (HOSPITAL_COMMUNITY): Payer: Managed Care, Other (non HMO)

## 2011-12-18 ENCOUNTER — Other Ambulatory Visit (HOSPITAL_COMMUNITY): Payer: Managed Care, Other (non HMO)

## 2011-12-18 ENCOUNTER — Ambulatory Visit (HOSPITAL_COMMUNITY): Admission: RE | Admit: 2011-12-18 | Payer: BC Managed Care – PPO | Source: Ambulatory Visit

## 2013-04-09 ENCOUNTER — Other Ambulatory Visit: Payer: Self-pay | Admitting: Obstetrics and Gynecology

## 2013-04-09 DIAGNOSIS — N63 Unspecified lump in unspecified breast: Secondary | ICD-10-CM

## 2013-04-23 ENCOUNTER — Ambulatory Visit
Admission: RE | Admit: 2013-04-23 | Discharge: 2013-04-23 | Disposition: A | Discharge status: Home / Self Care | Payer: Self-pay | Source: Ambulatory Visit | Attending: Obstetrics and Gynecology | Admitting: Obstetrics and Gynecology

## 2013-04-23 DIAGNOSIS — N63 Unspecified lump in unspecified breast: Secondary | ICD-10-CM

## 2013-09-17 ENCOUNTER — Other Ambulatory Visit: Payer: Self-pay | Admitting: Obstetrics and Gynecology

## 2013-09-17 DIAGNOSIS — N63 Unspecified lump in unspecified breast: Secondary | ICD-10-CM

## 2013-10-01 ENCOUNTER — Other Ambulatory Visit: Payer: Self-pay | Admitting: Emergency Medicine

## 2013-10-21 ENCOUNTER — Other Ambulatory Visit: Payer: BC Managed Care – PPO

## 2013-10-22 ENCOUNTER — Encounter (INDEPENDENT_AMBULATORY_CARE_PROVIDER_SITE_OTHER): Payer: Self-pay

## 2013-10-22 ENCOUNTER — Ambulatory Visit
Admission: RE | Admit: 2013-10-22 | Discharge: 2013-10-22 | Disposition: A | Discharge status: Home / Self Care | Payer: BC Managed Care – PPO | Source: Ambulatory Visit | Attending: Obstetrics and Gynecology | Admitting: Obstetrics and Gynecology

## 2013-10-22 DIAGNOSIS — N63 Unspecified lump in unspecified breast: Secondary | ICD-10-CM

## 2013-11-24 ENCOUNTER — Ambulatory Visit: Payer: Self-pay | Admitting: Emergency Medicine

## 2014-01-20 ENCOUNTER — Encounter: Payer: Self-pay | Admitting: Emergency Medicine

## 2014-03-03 ENCOUNTER — Encounter: Payer: Self-pay | Admitting: Emergency Medicine

## 2014-03-03 ENCOUNTER — Ambulatory Visit (INDEPENDENT_AMBULATORY_CARE_PROVIDER_SITE_OTHER): Payer: BC Managed Care – PPO | Admitting: Emergency Medicine

## 2014-03-03 VITALS — BP 96/60 | HR 68 | Temp 98.0°F | Resp 18 | Ht 68.5 in | Wt 166.0 lb

## 2014-03-03 DIAGNOSIS — R002 Palpitations: Secondary | ICD-10-CM | POA: Insufficient documentation

## 2014-03-03 DIAGNOSIS — D649 Anemia, unspecified: Secondary | ICD-10-CM

## 2014-03-03 DIAGNOSIS — Z111 Encounter for screening for respiratory tuberculosis: Secondary | ICD-10-CM

## 2014-03-03 DIAGNOSIS — Z0000 Encounter for general adult medical examination without abnormal findings: Secondary | ICD-10-CM

## 2014-03-03 MED ORDER — ALPRAZOLAM 0.5 MG PO TABS: ORAL_TABLET | ORAL | Status: DC

## 2014-03-03 NOTE — Progress Notes (Signed)
Subjective:    Patient ID: Audrey Davis, female    DOB: 09/21/1980, 33 y.o.   MRN: [id number]  HPI Comments: 33 yo WF CPE. She notes she is doing well overall and is without concerns. She is eating healthy and keeping busy.   She rarely uses xanax usually 1/2 tablet with increased stress and occasionally for sleep. She is doing internship for teaching and notes mild stress on occasion with internship and managing life.  She denies any unexplained fatigue or blood in stools with hx of AVM in the past.  She notes her allergies are staying controled with OTC Allegra.      Medication List       This list is accurate as of: 03/03/14 11:59 PM.  Always use your most recent med list.               ALPRAZolam 0.5 MG tablet  Commonly known as:  XANAX  TAKE 1/2 TO 1 TABLET BY MOUTH TWICE DAILY PRN     fexofenadine 60 MG tablet  Commonly known as:  ALLEGRA  Take 60 mg by mouth 2 (two) times daily as needed for allergies or rhinitis.     PRE-NATAL PO  Take by mouth daily.       Allergies  Allergen Reactions  . Hydrocodone     Itching   Past Medical History  Diagnosis Date  . AVM (arteriovenous malformation) of colon 2009  . Menses painful   . Palpitations   . Asthma    Past Surgical History  Procedure Laterality Date  . Wisdom tooth extraction    . Laproscopic     History  Substance Use Topics  . Smoking status: Never Smoker   . Smokeless tobacco: Not on file  . Alcohol Use: Not on file   Family History  Problem Relation Age of Onset  . Depression Mother   . Fibromyalgia Mother   . Hyperlipidemia Mother   . Cancer Maternal Grandmother 65  . Stroke Maternal Grandmother    MAINTENANCE: Colonoscopy:Per [facility] 2009? Mammo:04/23/13 repeat 6 months Pap/ Pelvic: 2014 WNL @ Gyn EYE: yearly Dentist:Q 6 month  IMMUNIZATIONS: Tdap: 2012 Influenza:  Patient Care Team: Unk Pinto, MD as PCP - General (Internal Medicine) Cyril Mourning, MD as Consulting  Physician (Obstetrics and Gynecology) Missy Sabins, MD as Consulting Physician (Gastroenterology) Macarthur Critchley, [place] as Referring Physician (Optometry) Jolayne Panther, (Dentist)   Review of Systems  Constitutional: Negative for fatigue.  Cardiovascular: Negative for chest pain.  Gastrointestinal: Negative for abdominal pain, diarrhea, constipation and anal bleeding.  Genitourinary: Negative for difficulty urinating.  All other systems reviewed and are negative.  BP 96/60  Pulse 68  Temp(Src) 98 F (36.7 C) (Temporal)  Resp 18  Ht 5' 8.5" (1.74 m)  Wt 166 lb (75.297 kg)  BMI 24.87 kg/m2  LMP 02/10/2014     Objective:   Physical Exam  Nursing note and vitals reviewed. Constitutional: She is oriented to person, place, and time. She appears well-developed and well-nourished. No distress.  HENT:  Head: Normocephalic and atraumatic.  Right Ear: External ear normal.  Left Ear: External ear normal.  Nose: Nose normal.  Mouth/Throat: Oropharynx is clear and moist.  Eyes: Conjunctivae and EOM are normal. Pupils are equal, round, and reactive to light. Right eye exhibits no discharge. Left eye exhibits no discharge. No scleral icterus.  Neck: Normal range of motion. Neck supple. No JVD present. No tracheal deviation present. No thyromegaly present.  Cardiovascular: Normal rate,  regular rhythm, normal heart sounds and intact distal pulses.   Pulmonary/Chest: Effort normal and breath sounds normal.  Abdominal: Soft. Bowel sounds are normal. She exhibits no distension and no mass. There is no tenderness. There is no rebound and no guarding.  Genitourinary:  Def gyn  Musculoskeletal: Normal range of motion. She exhibits no edema and no tenderness.  Lymphadenopathy:    She has no cervical adenopathy.  Neurological: She is alert and oriented to person, place, and time. She has normal reflexes. No cranial nerve deficit. She exhibits normal muscle tone. Coordination normal.  Skin: Skin is warm and dry. No  rash noted. No erythema. No pallor.  Psychiatric: She has a normal mood and affect. Her behavior is normal. Judgment and thought content normal.       EKG NSCSPT WNL [id number] Assessment & Plan:  1. CPE- Update screening labs/ History/ Immunizations/ Testing as needed. Advised healthy diet, QD exercise, increase H20 and continue RX/ Vitamins AD.  2. Anemia HX with AVM- Recheck labs, take Vitamins AD increase green leafy veggies, increase H2O, w/c if any symptom increase.   3. Anxiety- Controlled currently, continue RX AD w/c if SX increase or ER, recommend counseling if symptoms continue   4. Abnormal mammo- keep f/u 6 month advise weekly breast checks.

## 2014-03-03 NOTE — Patient Instructions (Signed)

## 2014-03-04 LAB — CBC WITH DIFFERENTIAL/PLATELET
Basophils Absolute: 0.1 10*3/uL (ref 0.0–0.1)
Basophils Relative: 1 % (ref 0–1)
Eosinophils Absolute: 0.3 10*3/uL (ref 0.0–0.7)
Eosinophils Relative: 4 % (ref 0–5)
HCT: 42.2 % (ref 36.0–46.0)
HEMOGLOBIN: 14.2 g/dL (ref 12.0–15.0)
LYMPHS PCT: 30 % (ref 12–46)
Lymphs Abs: 2.4 10*3/uL (ref 0.7–4.0)
MCH: 33 pg (ref 26.0–34.0)
MCHC: 33.6 g/dL (ref 30.0–36.0)
MCV: 98.1 fL (ref 78.0–100.0)
MONO ABS: 0.6 10*3/uL (ref 0.1–1.0)
MONOS PCT: 7 % (ref 3–12)
NEUTROS ABS: 4.6 10*3/uL (ref 1.7–7.7)
Neutrophils Relative %: 58 % (ref 43–77)
Platelets: 300 10*3/uL (ref 150–400)
RBC: 4.3 MIL/uL (ref 3.87–5.11)
RDW: 13.1 % (ref 11.5–15.5)
WBC: 7.9 10*3/uL (ref 4.0–10.5)

## 2014-03-04 LAB — URINALYSIS, ROUTINE W REFLEX MICROSCOPIC
Bilirubin Urine: NEGATIVE
GLUCOSE, UA: NEGATIVE mg/dL
HGB URINE DIPSTICK: NEGATIVE
Ketones, ur: 15 mg/dL — AB
Leukocytes, UA: NEGATIVE
Nitrite: NEGATIVE
PH: 5.5 (ref 5.0–8.0)
Protein, ur: NEGATIVE mg/dL
SPECIFIC GRAVITY, URINE: 1.016 (ref 1.005–1.030)
Urobilinogen, UA: 0.2 mg/dL (ref 0.0–1.0)

## 2014-03-04 LAB — IRON AND TIBC
%SAT: 32 % (ref 20–55)
Iron: 99 ug/dL (ref 42–145)
TIBC: 311 ug/dL (ref 250–470)
UIBC: 212 ug/dL (ref 125–400)

## 2014-03-04 LAB — BASIC METABOLIC PANEL WITH GFR
BUN: 11 mg/dL (ref 6–23)
CHLORIDE: 103 meq/L (ref 96–112)
CO2: 24 meq/L (ref 19–32)
Calcium: 9.4 mg/dL (ref 8.4–10.5)
Creat: 0.68 mg/dL (ref 0.50–1.10)
GFR, Est African American: >89 mL/min
GFR, Est Non African American: >89 mL/min
Glucose, Bld: 83 mg/dL (ref 70–99)
POTASSIUM: 4.1 meq/L (ref 3.5–5.3)
Sodium: 136 mEq/L (ref 135–145)

## 2014-03-04 LAB — LIPID PANEL
CHOL/HDL RATIO: 2.1 ratio
Cholesterol: 178 mg/dL (ref 0–200)
HDL: 83 mg/dL (ref >39)
LDL Cholesterol: 80 mg/dL (ref 0–99)
Triglycerides: 73 mg/dL (ref <150)
VLDL: 15 mg/dL (ref 0–40)

## 2014-03-04 LAB — TSH: TSH: 2.466 u[IU]/mL (ref 0.350–4.500)

## 2014-03-04 LAB — HEPATIC FUNCTION PANEL
ALBUMIN: 4.6 g/dL (ref 3.5–5.2)
ALT: 20 U/L (ref 0–35)
AST: 23 U/L (ref 0–37)
Alkaline Phosphatase: 46 U/L (ref 39–117)
BILIRUBIN TOTAL: 0.8 mg/dL (ref 0.2–1.2)
Bilirubin, Direct: 0.2 mg/dL (ref 0.0–0.3)
Indirect Bilirubin: 0.6 mg/dL (ref 0.2–1.2)
Total Protein: 6.8 g/dL (ref 6.0–8.3)

## 2014-03-04 LAB — MAGNESIUM: MAGNESIUM: 2.1 mg/dL (ref 1.5–2.5)

## 2014-03-04 LAB — HEMOGLOBIN A1C
Hgb A1c MFr Bld: 5.1 % (ref <5.7)
MEAN PLASMA GLUCOSE: 100 mg/dL (ref <117)

## 2014-03-04 LAB — FOLATE RBC: RBC Folate: 707 ng/mL (ref >280)

## 2014-03-04 LAB — VITAMIN B12: Vitamin B-12: 623 pg/mL (ref 211–911)

## 2014-03-04 LAB — INSULIN, FASTING: Insulin fasting, serum: 1.7 u[IU]/mL — ABNORMAL LOW (ref 2.0–19.6)

## 2014-03-04 LAB — VITAMIN D 25 HYDROXY (VIT D DEFICIENCY, FRACTURES): VIT D 25 HYDROXY: 42 ng/mL (ref 30–89)

## 2014-03-05 LAB — TB SKIN TEST
Induration: 0 mm
TB Skin Test: NEGATIVE

## 2014-03-12 ENCOUNTER — Ambulatory Visit (INDEPENDENT_AMBULATORY_CARE_PROVIDER_SITE_OTHER): Payer: BC Managed Care – PPO

## 2014-03-12 DIAGNOSIS — Z23 Encounter for immunization: Secondary | ICD-10-CM

## 2014-03-29 ENCOUNTER — Other Ambulatory Visit: Payer: Self-pay | Admitting: Obstetrics and Gynecology

## 2014-03-29 DIAGNOSIS — D241 Benign neoplasm of right breast: Secondary | ICD-10-CM

## 2014-04-07 ENCOUNTER — Other Ambulatory Visit: Payer: BC Managed Care – PPO

## 2014-04-07 DIAGNOSIS — R7989 Other specified abnormal findings of blood chemistry: Secondary | ICD-10-CM

## 2014-04-07 LAB — CBC WITH DIFFERENTIAL/PLATELET
BASOS ABS: 0 10*3/uL (ref 0.0–0.1)
Basophils Relative: 0 % (ref 0–1)
EOS PCT: 3 % (ref 0–5)
Eosinophils Absolute: 0.2 10*3/uL (ref 0.0–0.7)
HCT: 43.9 % (ref 36.0–46.0)
Hemoglobin: 14.5 g/dL (ref 12.0–15.0)
LYMPHS PCT: 17 % (ref 12–46)
Lymphs Abs: 1.3 10*3/uL (ref 0.7–4.0)
MCH: 33.7 pg (ref 26.0–34.0)
MCHC: 32.9 g/dL (ref 30.0–36.0)
MCV: 102.1 fL — ABNORMAL HIGH (ref 78.0–100.0)
Monocytes Absolute: 0.8 10*3/uL (ref 0.1–1.0)
Monocytes Relative: 11 % (ref 3–12)
Neutro Abs: 5.3 10*3/uL (ref 1.7–7.7)
Neutrophils Relative %: 69 % (ref 43–77)
PLATELETS: 253 10*3/uL (ref 150–400)
RBC: 4.3 MIL/uL (ref 3.87–5.11)
RDW: 12.3 % (ref 11.5–15.5)
WBC: 7.6 10*3/uL (ref 4.0–10.5)

## 2014-04-22 ENCOUNTER — Ambulatory Visit
Admission: RE | Admit: 2014-04-22 | Discharge: 2014-04-22 | Disposition: A | Discharge status: Home / Self Care | Payer: BC Managed Care – PPO | Source: Ambulatory Visit | Attending: Obstetrics and Gynecology | Admitting: Obstetrics and Gynecology

## 2014-04-22 DIAGNOSIS — D241 Benign neoplasm of right breast: Secondary | ICD-10-CM

## 2014-09-13 ENCOUNTER — Ambulatory Visit (INDEPENDENT_AMBULATORY_CARE_PROVIDER_SITE_OTHER): Payer: 59 | Admitting: Internal Medicine

## 2014-09-13 ENCOUNTER — Encounter: Payer: Self-pay | Admitting: Internal Medicine

## 2014-09-13 VITALS — BP 110/76 | HR 76 | Temp 97.7°F | Resp 16 | Ht 68.0 in | Wt 165.6 lb

## 2014-09-13 DIAGNOSIS — F411 Generalized anxiety disorder: Secondary | ICD-10-CM

## 2014-09-13 DIAGNOSIS — G4700 Insomnia, unspecified: Secondary | ICD-10-CM

## 2014-09-13 MED ORDER — ALPRAZOLAM 0.5 MG PO TABS: ORAL_TABLET | ORAL | Status: AC

## 2014-09-13 NOTE — Progress Notes (Signed)
   Subjective:    Patient ID: Audrey Davis, female    DOB: [birth year], 34 y.o.   MRN: [id number]  HPI  Patient presents to the office for several complaints.  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  She is also going on a trip to [place] for some Scientist, product/process development.  She is planning on being there for 3.5 weeks.  She is asking that her xanax be refilled.  She is also asking if there is a sleeping pill that she can take to help her on the plane ride to [place].    Review of Systems  Constitutional: Negative for fever, chills and fatigue.  HENT: Positive for rhinorrhea and sneezing. Negative for congestion, sore throat, trouble swallowing and voice change.   Eyes: Negative for itching.  Respiratory: Negative for cough, chest tightness and shortness of breath.   Cardiovascular: Negative for chest pain, palpitations and leg swelling.  Gastrointestinal: Negative for nausea, vomiting, abdominal pain, diarrhea and constipation.  Genitourinary: Negative.        Objective:   Physical Exam  Constitutional: She is oriented to person, place, and time. She appears well-developed and well-nourished. No distress.  HENT:  Head: Normocephalic and atraumatic.  Mouth/Throat: Oropharynx is clear and moist. No oropharyngeal exudate.  Eyes: Conjunctivae and EOM are normal. Pupils are equal, round, and reactive to light. No scleral icterus.  Neck: Normal range of motion. Neck supple. No JVD present. No thyromegaly present.  Cardiovascular: Normal rate, regular rhythm, normal heart sounds and intact distal pulses.  Exam reveals no gallop and no friction rub.   No murmur heard. Pulmonary/Chest: Effort normal and breath sounds normal. No respiratory distress. She has no wheezes. She has no rales. She exhibits no tenderness.  Abdominal: Soft. Bowel sounds are normal. She exhibits no distension and no mass. There is no  tenderness. There is no rebound and no guarding.  Musculoskeletal: Normal range of motion.  Lymphadenopathy:    She has no cervical adenopathy.  Neurological: She is alert and oriented to person, place, and time.  Skin: Skin is warm and dry. She is not diaphoretic.  Psychiatric: She has a normal mood and affect. Her behavior is normal. Judgment and thought content normal.  Nursing note and vitals reviewed.         Assessment & Plan:    1. Insomnia -patient told she can take 1 tablet for sleep on plane. - ALPRAZolam (XANAX) 0.5 MG tablet; TAKE 1/2 TO 1 TABLET BY MOUTH TWICE DAILY PRN  Dispense: 60 tablet; Refill: 0  2. Generalized anxiety disorder -refill given - ALPRAZolam (XANAX) 0.5 MG tablet; TAKE 1/2 TO 1 TABLET BY MOUTH TWICE DAILY PRN  Dispense: 60 tablet; Refill: 0  Forms filled out for [facility].  Patient to return to the office as needed.

## 2014-09-13 NOTE — Patient Instructions (Signed)
Alprazolam tablets What is this medicine? ALPRAZOLAM (al PRAY zoe lam) is a benzodiazepine. It is used to treat anxiety and panic attacks. This medicine may be used for other purposes; ask your health care provider or pharmacist if you have questions. COMMON BRAND NAME(S): Xanax What should I tell my health care provider before I take this medicine? They need to know if you have any of these conditions: -an alcohol or drug abuse problem -bipolar disorder, depression, psychosis or other mental health conditions -glaucoma -kidney or liver disease -lung or breathing disease -myasthenia gravis -Parkinson's disease -porphyria -seizures or a history of seizures -suicidal thoughts -an unusual or allergic reaction to alprazolam, other benzodiazepines, foods, dyes, or preservatives -pregnant or trying to get pregnant -breast-feeding How should I use this medicine? Take this medicine by mouth with a glass of water. Follow the directions on the prescription label. Take your medicine at regular intervals. Do not take it more often than directed. If you have been taking this medicine regularly for some time, do not suddenly stop taking it. You must gradually reduce the dose or you may get severe side effects. Ask your doctor or health care professional for advice. Even after you stop taking this medicine it can still affect your body for several days. Talk to your pediatrician regarding the use of this medicine in children. Special care may be needed. Overdosage: If you think you have taken too much of this medicine contact a poison control center or emergency room at once. NOTE: This medicine is only for you. Do not share this medicine with others. What if I miss a dose? If you miss a dose, take it as soon as you can. If it is almost time for your next dose, take only that dose. Do not take double or extra doses. What may interact with this medicine? Do not take this medicine with any of the  following medications: -certain medicines for HIV infection or AIDS -ketoconazole -itraconazole This medicine may also interact with the following medications: -birth control pills -certain macrolide antibiotics like clarithromycin, erythromycin, troleandomycin -cimetidine -cyclosporine -ergotamine -grapefruit juice -herbal or dietary supplements like kava kava, melatonin, dehydroepiandrosterone, DHEA, St. John's Wort or valerian -imatinib, STI-571 -isoniazid -levodopa -medicines for depression, anxiety, or psychotic disturbances -prescription pain medicines -rifampin, rifapentine, or rifabutin -some medicines for blood pressure or heart problems -some medicines for seizures like carbamazepine, oxcarbazepine, phenobarbital, phenytoin, primidone This list may not describe all possible interactions. Give your health care provider a list of all the medicines, herbs, non-prescription drugs, or dietary supplements you use. Also tell them if you smoke, drink alcohol, or use illegal drugs. Some items may interact with your medicine. What should I watch for while using this medicine? Visit your doctor or health care professional for regular checks on your progress. Your body can become dependent on this medicine. Ask your doctor or health care professional if you still need to take it. You may get drowsy or dizzy. Do not drive, use machinery, or do anything that needs mental alertness until you know how this medicine affects you. To reduce the risk of dizzy and fainting spells, do not stand or sit up quickly, especially if you are an older patient. Alcohol may increase dizziness and drowsiness. Avoid alcoholic drinks. Do not treat yourself for coughs, colds or allergies without asking your doctor or health care professional for advice. Some ingredients can increase possible side effects. What side effects may I notice from receiving this medicine? Side effects that you  should report to your doctor  or health care professional as soon as possible: -allergic reactions like skin rash, itching or hives, swelling of the face, lips, or tongue -confusion, forgetfulness -depression -difficulty sleeping -difficulty speaking -feeling faint or lightheaded, falls -mood changes, excitability or aggressive behavior -muscle cramps -trouble passing urine or change in the amount of urine -unusually weak or tired Side effects that usually do not require medical attention (report to your doctor or health care professional if they continue or are bothersome): -change in sex drive or performance -changes in appetite This list may not describe all possible side effects. Call your doctor for medical advice about side effects. You may report side effects to FDA at 1-800-FDA-1088. Where should I keep my medicine? Keep out of the reach of children. This medicine can be abused. Keep your medicine in a safe place to protect it from theft. Do not share this medicine with anyone. Selling or giving away this medicine is dangerous and against the law. Store at room temperature between 20 and 25 degrees C (68 and 77 degrees F). Throw away any unused medicine after the expiration date. NOTE: This sheet is a summary. It may not cover all possible information. If you have questions about this medicine, talk to your doctor, pharmacist, or health care provider.  2015, Elsevier/Gold Standard. (2007-08-28 10:34:46)

## 2015-03-10 ENCOUNTER — Encounter: Payer: Self-pay | Admitting: Internal Medicine

## 2015-03-10 ENCOUNTER — Encounter: Payer: Self-pay | Admitting: Emergency Medicine

## 2015-03-28 ENCOUNTER — Other Ambulatory Visit: Payer: Self-pay | Admitting: Obstetrics and Gynecology

## 2015-03-28 DIAGNOSIS — N6310 Unspecified lump in the right breast, unspecified quadrant: Secondary | ICD-10-CM

## 2015-04-13 ENCOUNTER — Ambulatory Visit
Admission: RE | Admit: 2015-04-13 | Discharge: 2015-04-13 | Disposition: A | Discharge status: Home / Self Care | Payer: BC Managed Care – PPO | Source: Ambulatory Visit | Attending: Obstetrics and Gynecology | Admitting: Obstetrics and Gynecology

## 2015-04-13 ENCOUNTER — Other Ambulatory Visit: Payer: Self-pay | Admitting: Obstetrics and Gynecology

## 2015-04-13 DIAGNOSIS — N6310 Unspecified lump in the right breast, unspecified quadrant: Secondary | ICD-10-CM

## 2015-04-14 ENCOUNTER — Other Ambulatory Visit: Payer: Self-pay | Admitting: Obstetrics and Gynecology

## 2015-04-14 ENCOUNTER — Ambulatory Visit
Admission: RE | Admit: 2015-04-14 | Discharge: 2015-04-14 | Disposition: A | Discharge status: Home / Self Care | Payer: BC Managed Care – PPO | Source: Ambulatory Visit | Attending: Obstetrics and Gynecology | Admitting: Obstetrics and Gynecology

## 2015-04-14 DIAGNOSIS — N6310 Unspecified lump in the right breast, unspecified quadrant: Secondary | ICD-10-CM

## 2015-04-25 ENCOUNTER — Encounter: Payer: Self-pay | Admitting: Physician Assistant

## 2015-06-29 ENCOUNTER — Other Ambulatory Visit: Payer: Self-pay | Admitting: General Surgery

## 2015-09-16 ENCOUNTER — Other Ambulatory Visit: Payer: Self-pay | Admitting: General Surgery

## 2016-03-12 ENCOUNTER — Encounter: Payer: Self-pay | Admitting: Internal Medicine

## 2016-03-16 IMAGING — MG MM DIAG BREAST TOMO BILATERAL
4 series · 4 of 12 positions shown · non-contrast
Comparison: Previous exam(s).

CLINICAL DATA: Short-term interval followup of a right breast mass.

EXAM:
DIGITAL DIAGNOSTIC BILATERAL MAMMOGRAM WITH 3D TOMOSYNTHESIS WITH
CAD
ULTRASOUND RIGHT BREAST

[L CC]
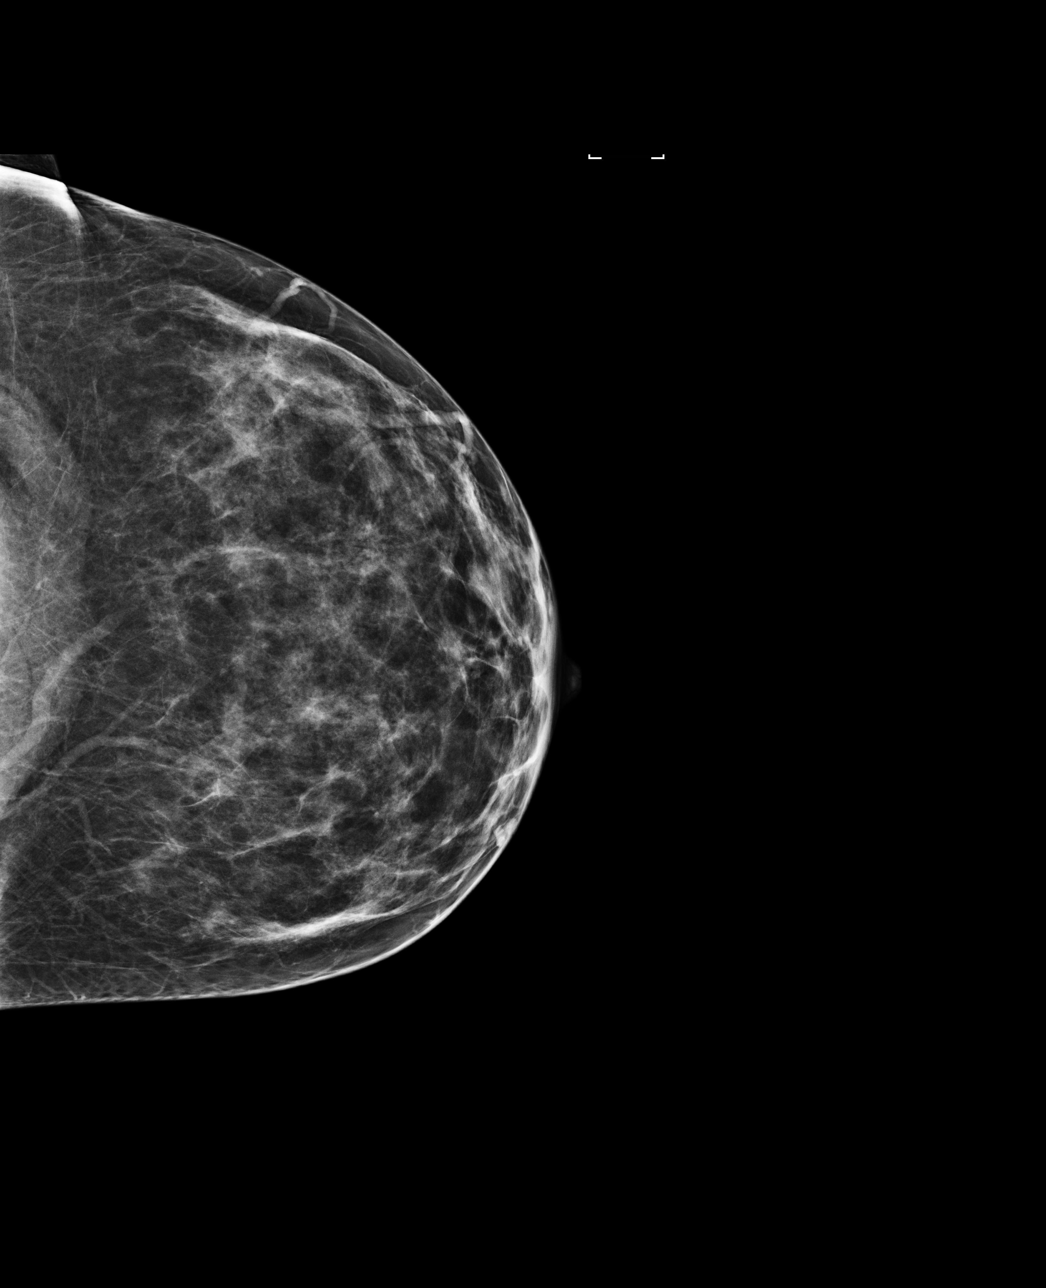

[R CC]
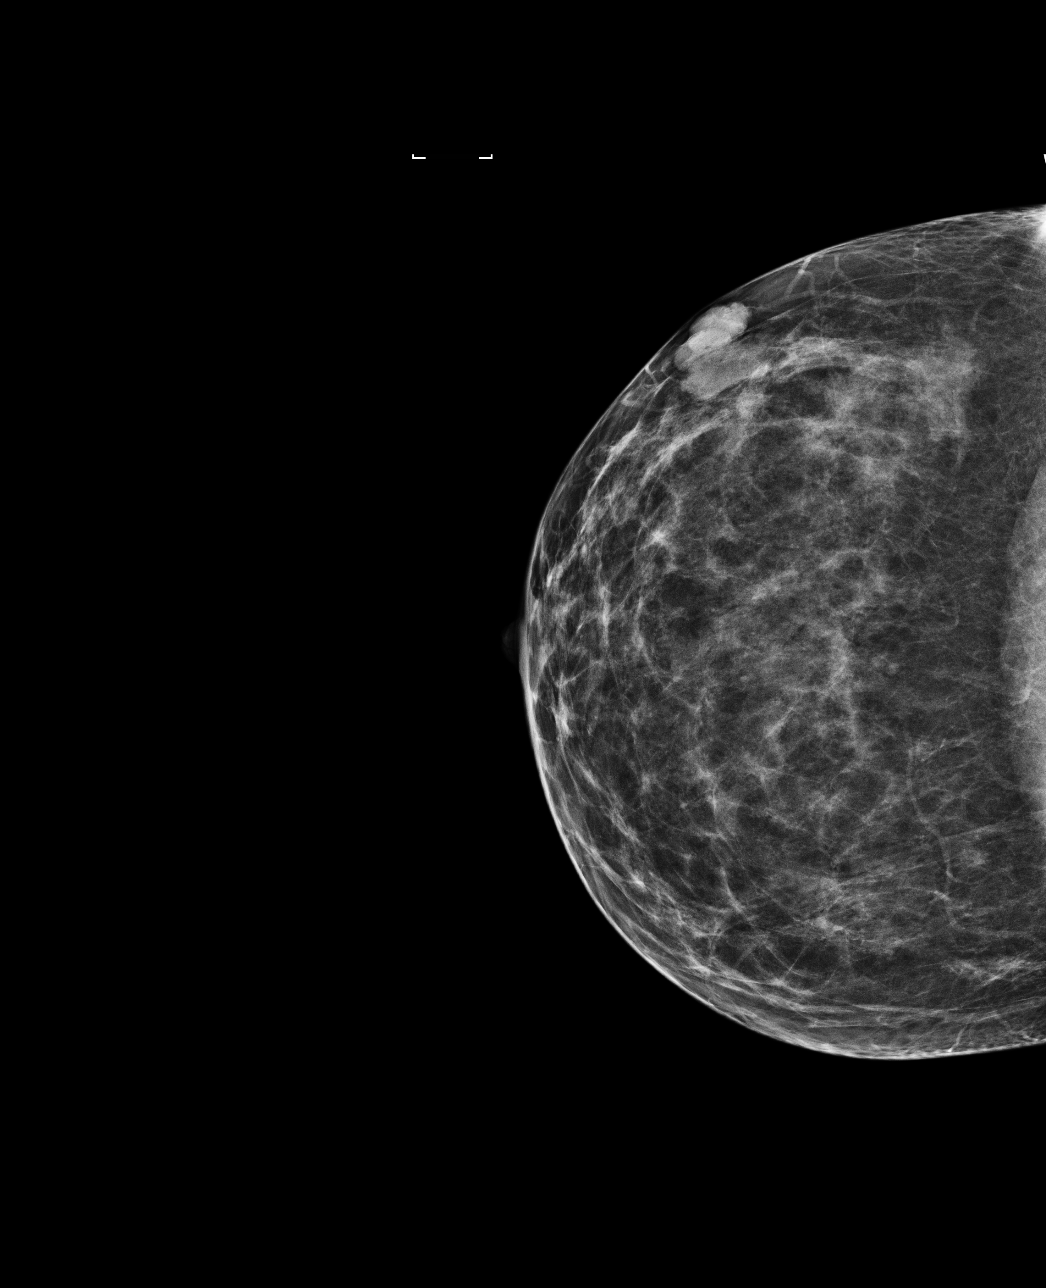

[L MLO tomo · tomo slice 37/73.0]
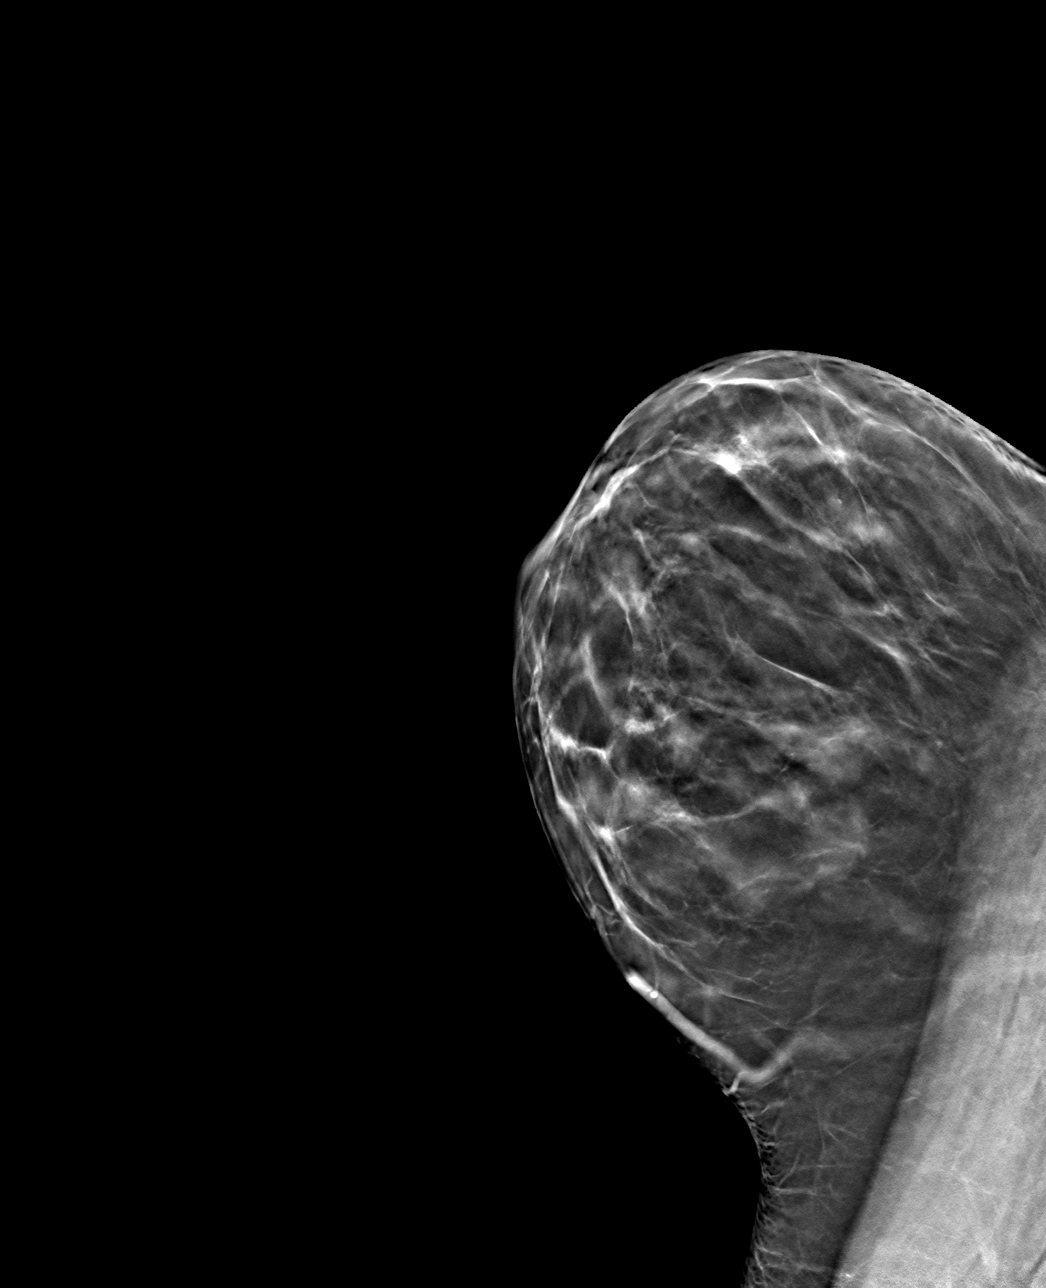

[L CC tomo · tomo slice 35/69.0]
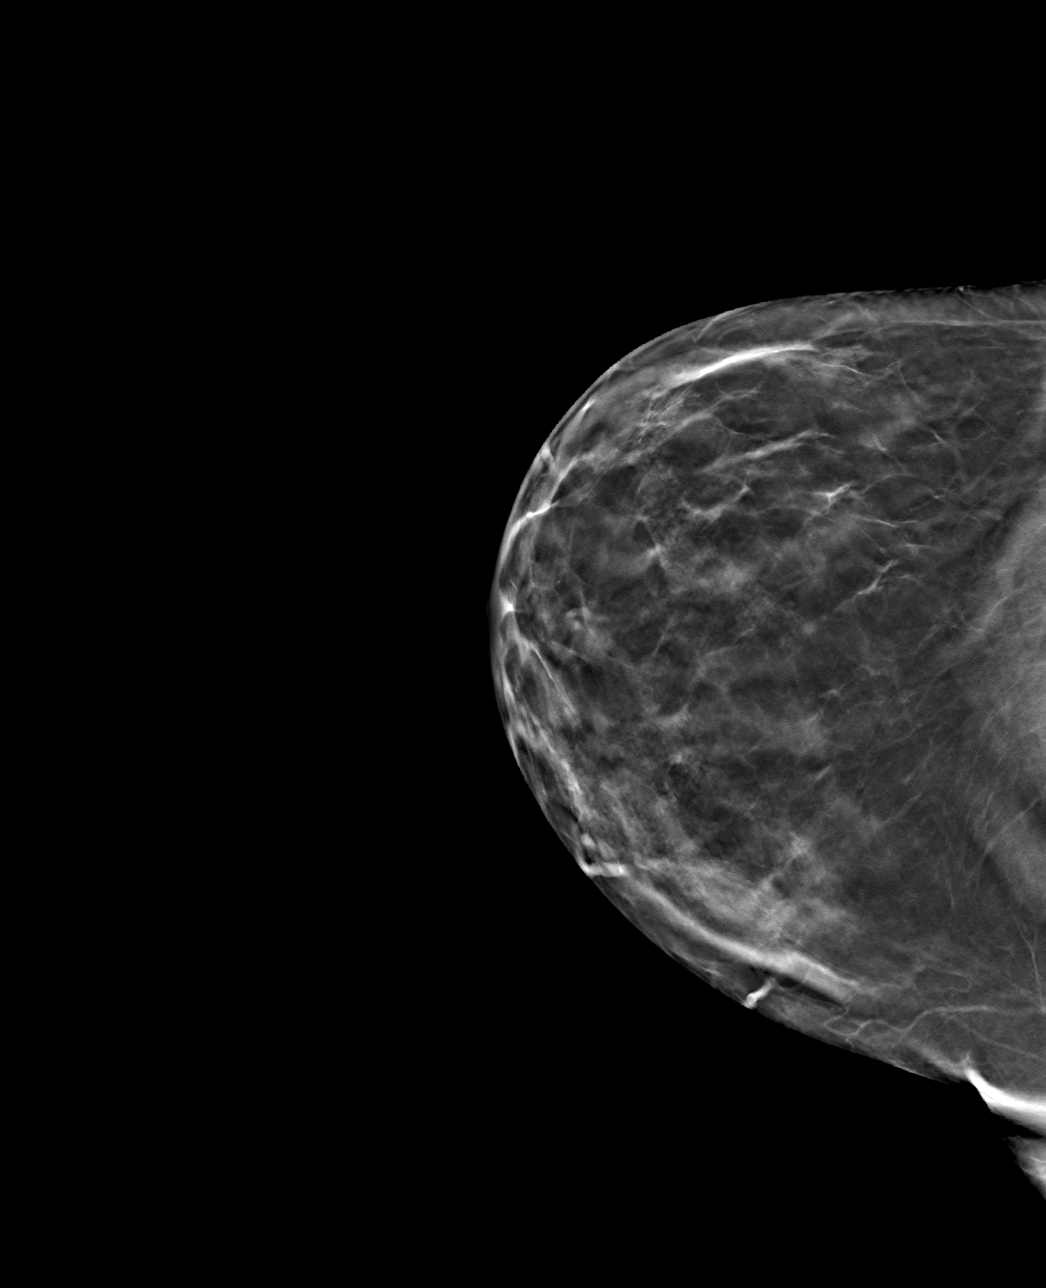

[4 of 12 positions shown; findings below may reference images not displayed]

ACR Breast Density Category c: The breast tissue is heterogeneously
dense, which may obscure small masses.
FINDINGS: There is a developing 2.1 x 1.8 x 1.8 cm mass in the upper-outer
quadrant of the right breast. There are no associated malignant type
microcalcifications. No additional lesions are seen in the right
breast. No mass or malignant type microcalcifications seen in the
left breast.

Mammographic images were processed with CAD.

On physical exam, I palpate a discrete mass in the right breast at
10 o'clock 4 cm from the nipple.

Targeted ultrasound is performed, showing a hypoechoic, lobulated
mass in the right breast at 10 o'clock 4 cm from the nipple
measuring 2.0 x 1.2 x 1.8 cm. On the prior ultrasound dated
04/23/2013 it measured 1.4 x 0.7 x 1.1 cm. Sonographic evaluation
the right axilla does not show any enlarged adenopathy.
IMPRESSION: Suspicious, developing mass in the 10 o'clock region of the right
breast.

RECOMMENDATION:
Ultrasound-guided core biopsy of the right breast mass is
recommended. This will be scheduled at the patient's convenience.

I have discussed the findings and recommendations with the patient.
Results were also provided in writing at the conclusion of the
visit. If applicable, a reminder letter will be sent to the patient
regarding the next appointment.

BI-RADS CATEGORY  4: Suspicious.

## 2016-04-24 ENCOUNTER — Encounter: Payer: Self-pay | Admitting: Physician Assistant

## 2017-08-12 ENCOUNTER — Ambulatory Visit: Payer: No Typology Code available for payment source | Admitting: Physician Assistant

## 2017-08-12 VITALS — BP 110/60 | HR 102 | Temp 98.1°F | Resp 14 | Ht 67.0 in | Wt 170.0 lb

## 2017-08-12 DIAGNOSIS — D649 Anemia, unspecified: Secondary | ICD-10-CM | POA: Diagnosis not present

## 2017-08-12 DIAGNOSIS — R000 Tachycardia, unspecified: Secondary | ICD-10-CM

## 2017-08-12 DIAGNOSIS — K921 Melena: Secondary | ICD-10-CM | POA: Diagnosis not present

## 2017-08-12 LAB — CBC WITH DIFFERENTIAL/PLATELET
BASOS PCT: 0.5 %
Basophils Absolute: 38 cells/uL (ref 0–200)
EOS PCT: 1.7 %
Eosinophils Absolute: 128 cells/uL (ref 15–500)
HCT: 19.3 % — ABNORMAL LOW (ref 35.0–45.0)
Hemoglobin: 6.4 g/dL — ABNORMAL LOW (ref 11.7–15.5)
LYMPHS ABS: 1913 {cells}/uL (ref 850–3900)
MCH: 34 pg — ABNORMAL HIGH (ref 27.0–33.0)
MCHC: 33.2 g/dL (ref 32.0–36.0)
MCV: 102.7 fL — ABNORMAL HIGH (ref 80.0–100.0)
MPV: 10.6 fL (ref 7.5–12.5)
Monocytes Relative: 8 %
NEUTROS PCT: 64.3 %
Neutro Abs: 4823 cells/uL (ref 1500–7800)
Platelets: 319 10*3/uL (ref 140–400)
RBC: 1.88 10*6/uL — AB (ref 3.80–5.10)
RDW: 12.8 % (ref 11.0–15.0)
TOTAL LYMPHOCYTE: 25.5 %
WBC: 7.5 10*3/uL (ref 3.8–10.8)
WBCMIX: 600 {cells}/uL (ref 200–950)

## 2017-08-12 LAB — LACTATE DEHYDROGENASE: LDH: 114 U/L (ref 100–200)

## 2017-08-12 MED ORDER — DEXLANSOPRAZOLE 60 MG PO CPDR: 60.0000 mg | DELAYED_RELEASE_CAPSULE | Freq: Every day | ORAL | 0 refills | Status: AC

## 2017-08-12 NOTE — Progress Notes (Signed)
Subjective:    Patient ID: Audrey Davis, female    DOB: August 16, 1980, 37 y.o.   MRN: [id number]  HPI 37 y.o. WF presents with AB pain.  She has history of AVM upper  GI bleed 10 years ago.  Husband is here and gives history. She was in [place] for conference last week, had some wine/naproxen there for menses which she does monthly, flew back Wednesday and started to not feel well, had tachy/fatigue. She has very dark black stool Saturday with weakness and fatigue, went to the ER on Saturday. They checked her H/H and she was 7.5, she started "blood builders Friday".  She has not had any more black stools, no BM since that time, she is eating/drinking, no nausea/vomiting. She has had HA, dry mouth, fatigue, dizziness. She has no AB pain, some lower back pain.    Blood pressure 110/60, pulse (!) 102, temperature 98.1 F (36.7 C), resp. rate 14, height 5\' 7"  (1.702 m), weight 170 lb (77.1 kg), SpO2 95 %.  Medications Current Outpatient Medications on File Prior to Visit  Medication Sig  . OVER THE COUNTER MEDICATION Take 1 tablet by mouth 2 (two) times daily.  Marland Kitchen ALPRAZolam (XANAX) 0.5 MG tablet TAKE 1/2 TO 1 TABLET BY MOUTH TWICE DAILY PRN (Patient not taking: Reported on 08/12/2017)   No current facility-administered medications on file prior to visit.     Problem list She has Palpitations on their problem list.  Review of Systems See HPI    Objective:   Physical Exam  Constitutional: She is oriented to person, place, and time. She appears well-developed and well-nourished.  HENT:  Head: Normocephalic and atraumatic.  Right Ear: External ear normal.  Left Ear: External ear normal.  Mouth/Throat: Oropharynx is clear and moist.  Eyes: Conjunctivae and EOM are normal. Pupils are equal, round, and reactive to light.  Neck: Normal range of motion. Neck supple. No thyromegaly present.  Cardiovascular: Normal rate, regular rhythm and normal heart sounds. Exam reveals no gallop and no  friction rub.  No murmur heard. Pulmonary/Chest: Effort normal and breath sounds normal. No respiratory distress. She has no wheezes.  Abdominal: Soft. She exhibits no shifting dullness, no distension, no abdominal bruit, no pulsatile midline mass and no mass. Bowel sounds are decreased. There is no hepatosplenomegaly. There is tenderness in the epigastric area. There is no rigidity, no rebound, no guarding, no CVA tenderness, no tenderness at McBurney's point and negative Murphy's sign. No hernia.  Musculoskeletal: Normal range of motion.  Lymphadenopathy:    She has no cervical adenopathy.  Neurological: She is alert and oriented to person, place, and time.  Skin: Skin is warm and dry.  Psychiatric: She has a normal mood and affect.       Assessment & Plan:  Diagnoses and all orders for this visit:  Melena with anemia ? From AVM versus NSAIDS/ETOH combo - if AVM? Need for vascular/collagen work up Will treat with dexilant, continue iron Will get stat labs- if H/H is lower than 7.5 or if LDH is elevated will send to ER Will send to GI urgently, saw Dr. Amedeo Plenty in the past Patient declines the ER, she knows if she has any worsening AB pain, any black stools, any vomiting, or any change to go to the ER to rule out obstruction, severe anemia, acute GI bleed.  -     CBC with Differential/Platelet -     Ambulatory referral to Gastroenterology -     Lactate dehydrogenase -  dexlansoprazole (DEXILANT) 60 MG capsule; Take 1 capsule (60 mg total) by mouth daily.  Call Arnette Norris (husband with results) [phone]- cell phone

## 2017-08-12 NOTE — Patient Instructions (Signed)
Gastrointestinal Bleeding °Gastrointestinal (GI) bleeding is bleeding somewhere along the digestive tract, between the mouth and anus. This can be caused by various problems. The severity of these problems can range from mild to serious or even life-threatening. If you have GI bleeding, you may find blood in your stools (feces), you may have black stools, or you may vomit blood. If there is a lot of bleeding, you may need to stay in the hospital. °What are the causes? °This condition may be caused by: °· Esophagitis. This is inflammation, irritation, or swelling of the esophagus. °· Hemorrhoids. These are swollen veins in the rectum. °· Anal fissures. These are areas of painful tearing that are often caused by passing hard stool. °· Diverticulosis. These are pouches that form on the colon over time, with age, and may bleed a lot. °· Diverticulitis. This is inflammation in areas with diverticulosis. It can cause pain, fever, and bloody stools, although bleeding may be mild. °· Polyps and cancer. Colon cancer often starts out as precancerous polyps. °· Gastritis and ulcers. With these, bleeding may come from the upper GI tract, near the stomach. ° °What are the signs or symptoms? °Symptoms of this condition may include: °· Bright red blood in your vomit, or vomit that looks like coffee grounds. °· Bloody, black, or tarry stools. °? Bleeding from the lower GI tract will usually cause red or maroon blood in the stools. °? Bleeding from the upper GI tract may cause black, tarry, often bad-smelling stools. °? In certain cases, if the bleeding is fast enough, the stools may be red. °· Pain or cramping in the abdomen. ° °How is this diagnosed? °This condition may be diagnosed based on: °· Medical history and physical exam. °· Various tests, such as: °? Blood tests. °? X-rays and other imaging tests. °? Esophagogastroduodenoscopy (EGD). In this test, a flexible, lighted tube is used to look at your esophagus, stomach, and  small intestine. °? Colonoscopy. In this test, a flexible, lighted tube is used to look at your colon. ° °How is this treated? °Treatment for this condition depends on the cause of the bleeding. For example: °· For bleeding from the esophagus, stomach, small intestine, or colon, the health care provider doing your EGD or colonoscopy may be able to stop the bleeding as part of the procedure. °· Inflammation or infection of the colon can be treated with medicines. °· Certain rectal problems can be treated with creams, suppositories, or warm baths. °· Surgery is sometimes needed. °· Blood transfusions are sometimes needed if a lot of blood has been lost. ° °If bleeding is slow, you may be allowed to go home. If there is a lot of bleeding, you will need to stay in the hospital for observation. °Follow these instructions at home: °· Take over-the-counter and prescription medicines only as told by your health care provider. °· Eat foods that are high in fiber. This will help to keep your stools soft. These foods include whole grains, legumes, fruits, and vegetables. Eating 1-3 prunes each day works well for many people. °· Drink enough fluid to keep your urine clear or pale yellow. °· Keep all follow-up visits as told by your health care provider. This is important. °Contact a health care provider if: °· Your symptoms do not improve. °Get help right away if: °· Your bleeding increases. °· You feel light-headed or you faint. °· You feel weak. °· You have severe cramps in your back or abdomen. °· You pass large blood clots in your stool. °·   Your symptoms are getting worse. °This information is not intended to replace advice given to you by your health care provider. Make sure you discuss any questions you have with your health care provider. °Document Released: 06/01/2000 Document Revised: 11/02/2015 Document Reviewed: 11/22/2014 °Elsevier Interactive Patient Education © 2018 [employer] ° °

## 2017-08-19 NOTE — Progress Notes (Signed)
Hospital follow up  Assessment and Plan: Hospital visit follow up for  Chronic blood loss anemia Check labs Stop dexilant Can get on probiotic Add vitamin C 500mg  with body builder Add on Bcomplex Stop NSAIDS  Hospital discharge meds were reviewed, and reconciled with the patient.    HPI 37 y.o.female presents for follow up for transition from recent hospitalization. Admit date to the hospital was 08/12/2017 patient was discharged from the hospital on 08/15/2017, and our clinical staff contacted the office the day after discharge to set up a follow up appointment. The discharge summary, medications, and diagnostic test results were reviewed before meeting with the patient. The patient was admitted for:  Severe anemia due to chronic blood loss anemia.  She was given IV iron and 3 bags of blood in the hospital. She had a normal EGD and colonoscopy was normal other than old blood in the upper colon. She had CT AB that was normal. They are suggesting a pill endoscopy. She is "self pay", deductible is 8000.  Her H/H went from 7.1 to 6.9 on discharge BEFORE the 3rd unit of blood with macrocytosis of 102.4. She left due to her husband's dad dying.  Her Calcium was 8.2 on discharge. B12 was 371, folate is 1321. TIBC 370. Iron was 44.  She is still on blood builders, not taking vitamin C with it. She is still on dexilant but EGD was normal.   She still has some SOB with exertion but states overall she is feeling much better. Her BP is steady, she has better color.   Home health is not involved.    Past Medical History:  Diagnosis Date  . Asthma   . AVM (arteriovenous malformation) of colon 2009  . Menses painful   . Palpitations      Allergies  Allergen Reactions  . Hydrocodone     Itching      Current Outpatient Medications on File Prior to Visit  Medication Sig Dispense Refill  . ALPRAZolam (XANAX) 0.5 MG tablet TAKE 1/2 TO 1 TABLET BY MOUTH TWICE DAILY PRN (Patient not taking:  Reported on 08/12/2017) 60 tablet 0  . dexlansoprazole (DEXILANT) 60 MG capsule Take 1 capsule (60 mg total) by mouth daily. 30 capsule 0  . OVER THE COUNTER MEDICATION Take 1 tablet by mouth 2 (two) times daily.     No current facility-administered medications on file prior to visit.     ROS: all negative except above.   Physical Exam: There were no vitals filed for this visit. There were no vitals taken for this visit. General Appearance: Well nourished, in no apparent distress. Eyes: PERRLA, EOMs, conjunctiva no swelling or erythema Sinuses: No Frontal/maxillary tenderness ENT/Mouth: Ext aud canals clear, TMs without erythema, bulging. No erythema, swelling, or exudate on post pharynx.  Tonsils not swollen or erythematous. Hearing normal.  Neck: Supple, thyroid normal.  Respiratory: Respiratory effort normal, BS equal bilaterally without rales, rhonchi, wheezing or stridor.  Cardio: RRR with no MRGs. Brisk peripheral pulses without edema.  Abdomen: Soft, + BS.  Non tender, no guarding, rebound, hernias, masses. Lymphatics: Non tender without lymphadenopathy.  Musculoskeletal: Full ROM, 5/5 strength, normal gait.  Skin: Warm, dry without rashes, lesions, ecchymosis.  Neuro: Cranial nerves intact. Normal muscle tone, no cerebellar symptoms. Sensation intact.  Psych: Awake and oriented X 3, normal affect, Insight and Judgment appropriate.     Vicie Mutters, PA-C 7:26 AM [facility]

## 2017-08-20 ENCOUNTER — Ambulatory Visit: Payer: No Typology Code available for payment source | Admitting: Physician Assistant

## 2017-08-20 ENCOUNTER — Encounter: Payer: Self-pay | Admitting: Physician Assistant

## 2017-08-20 VITALS — BP 120/60 | HR 84 | Temp 97.7°F | Ht 67.0 in | Wt 173.0 lb

## 2017-08-20 DIAGNOSIS — D500 Iron deficiency anemia secondary to blood loss (chronic): Secondary | ICD-10-CM | POA: Diagnosis not present

## 2017-08-20 LAB — CBC WITH DIFFERENTIAL/PLATELET
Basophils Absolute: 63 cells/uL (ref 0–200)
Basophils Relative: 0.9 %
Eosinophils Absolute: 140 cells/uL (ref 15–500)
Eosinophils Relative: 2 %
HCT: 34.8 % — ABNORMAL LOW (ref 35.0–45.0)
Hemoglobin: 11.8 g/dL (ref 11.7–15.5)
LYMPHS ABS: 1225 {cells}/uL (ref 850–3900)
MCH: 33.4 pg — ABNORMAL HIGH (ref 27.0–33.0)
MCHC: 33.9 g/dL (ref 32.0–36.0)
MCV: 98.6 fL (ref 80.0–100.0)
MPV: 9.9 fL (ref 7.5–12.5)
Monocytes Relative: 7.1 %
Neutro Abs: 5075 cells/uL (ref 1500–7800)
Neutrophils Relative %: 72.5 %
PLATELETS: 398 10*3/uL (ref 140–400)
RBC: 3.53 10*6/uL — AB (ref 3.80–5.10)
RDW: 14.4 % (ref 11.0–15.0)
TOTAL LYMPHOCYTE: 17.5 %
WBC: 7 10*3/uL (ref 3.8–10.8)
WBCMIX: 497 {cells}/uL (ref 200–950)

## 2017-08-20 LAB — BASIC METABOLIC PANEL WITH GFR
BUN: 12 mg/dL (ref 7–25)
CALCIUM: 10.2 mg/dL (ref 8.6–10.2)
CHLORIDE: 110 mmol/L (ref 98–110)
CO2: 24 mmol/L (ref 20–32)
Creat: 0.83 mg/dL (ref 0.50–1.10)
GFR, EST AFRICAN AMERICAN: 105 mL/min/{1.73_m2} (ref >=60)
GFR, Est Non African American: 91 mL/min/{1.73_m2} (ref >=60)
Glucose, Bld: 84 mg/dL (ref 65–99)
Potassium: 4.5 mmol/L (ref 3.5–5.3)
Sodium: 146 mmol/L (ref 135–146)

## 2017-08-20 LAB — IRON, TOTAL/TOTAL IRON BINDING CAP
%SAT: 13 % (calc) (ref 11–50)
Iron: 49 ug/dL (ref 40–190)
TIBC: 370 ug/dL (ref 250–450)

## 2017-08-20 LAB — RETICULOCYTES
ABS Retic: 196350 cells/uL — ABNORMAL HIGH (ref 20000–8000)
RETIC CT PCT: 5.5 %

## 2017-08-20 NOTE — Patient Instructions (Signed)
Stop dexilant Can get on probiotic Add vitamin C 571m with body builder Add on Bcomplex Stop NSAIDS  You can take tylenol (5012m or tylenol arthritis (65059mwith the meloxicam/antiinflammatories. The max you can take of tylenol a day is 3000m85mily, this is a max of 6 pills a day of the regular tyelnol (500mg67m a max of 4 a day of the tylenol arthritis (650mg)1mlong as no other medications you are taking contain tylenol.    IF any worsening symptoms go to ER   Anemia Anemia is a condition in which you do not have enough red blood cells or hemoglobin. Hemoglobin is a substance in red blood cells that carries oxygen. When you do not have enough red blood cells or hemoglobin (are anemic), your body cannot get enough oxygen and your organs may not work properly. As a result, you may feel very tired or have other problems. What are the causes? Common causes of anemia include:  Excessive bleeding. Anemia can be caused by excessive bleeding inside or outside the body, including bleeding from the intestine or from periods in women.  Poor nutrition.  Long-lasting (chronic) kidney, thyroid, and liver disease.  Bone marrow disorders.  Cancer and treatments for cancer.  HIV (human immunodeficiency virus) and AIDS (acquired immunodeficiency syndrome).  Treatments for HIV and AIDS.  Spleen problems.  Blood disorders.  Infections, medicines, and autoimmune disorders that destroy red blood cells.  What are the signs or symptoms? Symptoms of this condition include:  Minor weakness.  Dizziness.  Headache.  Feeling heartbeats that are irregular or faster than normal (palpitations).  Shortness of breath, especially with exercise.  Paleness.  Cold sensitivity.  Indigestion.  Nausea.  Difficulty sleeping.  Difficulty concentrating.  Symptoms may occur suddenly or develop slowly. If your anemia is mild, you may not have symptoms. How is this diagnosed? This condition  is diagnosed based on:  Blood tests.  Your medical history.  A physical exam.  Bone marrow biopsy.  Your health care provider may also check your stool (feces) for blood and may do additional testing to look for the cause of your bleeding. You may also have other tests, including:  Imaging tests, such as a CT scan or MRI.  Endoscopy.  Colonoscopy.  How is this treated? Treatment for this condition depends on the cause. If you continue to lose a lot of blood, you may need to be treated at a hospital. Treatment may include:  Taking supplements of iron, vitamin B12, oU93olic acid.  Taking a hormone medicine (erythropoietin) that can help to stimulate red blood cell growth.  Having a blood transfusion. This may be needed if you lose a lot of blood.  Making changes to your diet.  Having surgery to remove your spleen.  Follow these instructions at home:  Take over-the-counter and prescription medicines only as told by your health care provider.  Take supplements only as told by your health care provider.  Follow any diet instructions that you were given.  Keep all follow-up visits as told by your health care provider. This is important. Contact a health care provider if:  You develop new bleeding anywhere in the body. Get help right away if:  You are very weak.  You are short of breath.  You have pain in your abdomen or chest.  You are dizzy or feel faint.  You have trouble concentrating.  You have bloody or black, tarry stools.  You vomit repeatedly or you vomit up  blood. Summary  Anemia is a condition in which you do not have enough red blood cells or enough of a substance in your red blood cells that carries oxygen (hemoglobin).  Symptoms may occur suddenly or develop slowly.  If your anemia is mild, you may not have symptoms.  This condition is diagnosed with blood tests as well as a medical history and physical exam. Other tests may be  needed.  Treatment for this condition depends on the cause of the anemia. This information is not intended to replace advice given to you by your health care provider. Make sure you discuss any questions you have with your health care provider. Document Released: 07/12/2004 Document Revised: 07/06/2016 Document Reviewed: 07/06/2016 Elsevier Interactive Patient Education  [employer].

## 2020-07-19 ENCOUNTER — Other Ambulatory Visit: Payer: Self-pay | Admitting: Obstetrics and Gynecology

## 2020-07-19 DIAGNOSIS — Z1231 Encounter for screening mammogram for malignant neoplasm of breast: Secondary | ICD-10-CM

## 2020-09-05 ENCOUNTER — Inpatient Hospital Stay: Admission: RE | Admit: 2020-09-05 | Payer: BC Managed Care – PPO | Source: Ambulatory Visit

## 2022-09-21 DIAGNOSIS — Z131 Encounter for screening for diabetes mellitus: Secondary | ICD-10-CM | POA: Diagnosis not present

## 2022-09-21 DIAGNOSIS — Z1321 Encounter for screening for nutritional disorder: Secondary | ICD-10-CM | POA: Diagnosis not present

## 2022-09-21 DIAGNOSIS — Z6827 Body mass index (BMI) 27.0-27.9, adult: Secondary | ICD-10-CM | POA: Diagnosis not present

## 2022-09-21 DIAGNOSIS — Z13220 Encounter for screening for lipoid disorders: Secondary | ICD-10-CM | POA: Diagnosis not present

## 2022-09-21 DIAGNOSIS — Z1231 Encounter for screening mammogram for malignant neoplasm of breast: Secondary | ICD-10-CM | POA: Diagnosis not present

## 2022-09-21 DIAGNOSIS — Z1329 Encounter for screening for other suspected endocrine disorder: Secondary | ICD-10-CM | POA: Diagnosis not present

## 2022-09-21 DIAGNOSIS — Z130 Encounter for screening for diseases of the blood and blood-forming organs and certain disorders involving the immune mechanism: Secondary | ICD-10-CM | POA: Diagnosis not present

## 2022-09-21 DIAGNOSIS — Z01419 Encounter for gynecological examination (general) (routine) without abnormal findings: Secondary | ICD-10-CM | POA: Diagnosis not present

## 2022-09-21 DIAGNOSIS — Z13228 Encounter for screening for other metabolic disorders: Secondary | ICD-10-CM | POA: Diagnosis not present

## 2023-12-17 DIAGNOSIS — Z6828 Body mass index (BMI) 28.0-28.9, adult: Secondary | ICD-10-CM | POA: Diagnosis not present

## 2023-12-17 DIAGNOSIS — Z01419 Encounter for gynecological examination (general) (routine) without abnormal findings: Secondary | ICD-10-CM | POA: Diagnosis not present

## 2023-12-17 DIAGNOSIS — Z1231 Encounter for screening mammogram for malignant neoplasm of breast: Secondary | ICD-10-CM | POA: Diagnosis not present
